# Patient Record
Sex: Female | Born: 1935
Health system: Southern US, Community
[De-identification: ages and names within clinical notes are randomized; demographics above are authoritative.]

## PROBLEM LIST (undated history)

## (undated) DIAGNOSIS — K219 Gastro-esophageal reflux disease without esophagitis: Secondary | ICD-10-CM

## (undated) DIAGNOSIS — R062 Wheezing: Secondary | ICD-10-CM

## (undated) DIAGNOSIS — R0600 Dyspnea, unspecified: Secondary | ICD-10-CM

## (undated) DIAGNOSIS — H9190 Unspecified hearing loss, unspecified ear: Secondary | ICD-10-CM

## (undated) DIAGNOSIS — R682 Dry mouth, unspecified: Secondary | ICD-10-CM

## (undated) DIAGNOSIS — R609 Edema, unspecified: Secondary | ICD-10-CM

## (undated) DIAGNOSIS — J301 Allergic rhinitis due to pollen: Secondary | ICD-10-CM

## (undated) DIAGNOSIS — R413 Other amnesia: Secondary | ICD-10-CM

## (undated) DIAGNOSIS — M810 Age-related osteoporosis without current pathological fracture: Secondary | ICD-10-CM

## (undated) DIAGNOSIS — H3530 Unspecified macular degeneration: Secondary | ICD-10-CM

## (undated) DIAGNOSIS — C801 Malignant (primary) neoplasm, unspecified: Secondary | ICD-10-CM

## (undated) HISTORY — PX: ABDOMINAL HYSTERECTOMY: SHX81

## (undated) HISTORY — DX: Gastro-esophageal reflux disease without esophagitis: K21.9

## (undated) HISTORY — DX: Allergic rhinitis due to pollen: J30.1

## (undated) HISTORY — DX: Age-related osteoporosis without current pathological fracture: M81.0

## (undated) HISTORY — PX: JOINT REPLACEMENT: SHX530

## (undated) HISTORY — DX: Unspecified macular degeneration: H35.30

## (undated) HISTORY — PX: TOTAL HIP ARTHROPLASTY: SHX124

---

## 2005-02-05 ENCOUNTER — Emergency Department: Payer: Self-pay | Admitting: Emergency Medicine

## 2009-09-29 ENCOUNTER — Ambulatory Visit: Payer: Self-pay | Admitting: Internal Medicine

## 2011-03-09 ENCOUNTER — Emergency Department: Payer: Self-pay | Admitting: Unknown Physician Specialty

## 2011-09-12 DIAGNOSIS — H353190 Nonexudative age-related macular degeneration, unspecified eye, stage unspecified: Secondary | ICD-10-CM | POA: Diagnosis not present

## 2012-06-22 DIAGNOSIS — S0003XA Contusion of scalp, initial encounter: Secondary | ICD-10-CM | POA: Diagnosis not present

## 2012-06-22 DIAGNOSIS — Z96649 Presence of unspecified artificial hip joint: Secondary | ICD-10-CM | POA: Diagnosis not present

## 2012-06-22 DIAGNOSIS — M899 Disorder of bone, unspecified: Secondary | ICD-10-CM | POA: Diagnosis not present

## 2012-06-22 DIAGNOSIS — S61409A Unspecified open wound of unspecified hand, initial encounter: Secondary | ICD-10-CM | POA: Diagnosis not present

## 2012-06-22 DIAGNOSIS — S4980XA Other specified injuries of shoulder and upper arm, unspecified arm, initial encounter: Secondary | ICD-10-CM | POA: Diagnosis not present

## 2012-06-22 DIAGNOSIS — M19049 Primary osteoarthritis, unspecified hand: Secondary | ICD-10-CM | POA: Diagnosis not present

## 2012-06-22 DIAGNOSIS — IMO0002 Reserved for concepts with insufficient information to code with codable children: Secondary | ICD-10-CM | POA: Diagnosis not present

## 2012-06-22 DIAGNOSIS — S0990XA Unspecified injury of head, initial encounter: Secondary | ICD-10-CM | POA: Diagnosis not present

## 2012-06-22 DIAGNOSIS — G8911 Acute pain due to trauma: Secondary | ICD-10-CM | POA: Diagnosis not present

## 2012-06-22 DIAGNOSIS — T148XXA Other injury of unspecified body region, initial encounter: Secondary | ICD-10-CM | POA: Diagnosis not present

## 2012-06-22 DIAGNOSIS — T1490XA Injury, unspecified, initial encounter: Secondary | ICD-10-CM | POA: Diagnosis not present

## 2012-06-22 DIAGNOSIS — W19XXXA Unspecified fall, initial encounter: Secondary | ICD-10-CM | POA: Diagnosis not present

## 2012-06-22 DIAGNOSIS — M949 Disorder of cartilage, unspecified: Secondary | ICD-10-CM | POA: Diagnosis not present

## 2012-06-22 DIAGNOSIS — S1093XA Contusion of unspecified part of neck, initial encounter: Secondary | ICD-10-CM | POA: Diagnosis not present

## 2012-06-22 DIAGNOSIS — S6990XA Unspecified injury of unspecified wrist, hand and finger(s), initial encounter: Secondary | ICD-10-CM | POA: Diagnosis not present

## 2012-06-23 DIAGNOSIS — S61409A Unspecified open wound of unspecified hand, initial encounter: Secondary | ICD-10-CM | POA: Diagnosis not present

## 2012-06-23 DIAGNOSIS — Z09 Encounter for follow-up examination after completed treatment for conditions other than malignant neoplasm: Secondary | ICD-10-CM | POA: Diagnosis not present

## 2012-06-23 DIAGNOSIS — G8911 Acute pain due to trauma: Secondary | ICD-10-CM | POA: Diagnosis not present

## 2012-06-23 DIAGNOSIS — Z96649 Presence of unspecified artificial hip joint: Secondary | ICD-10-CM | POA: Diagnosis not present

## 2012-06-23 DIAGNOSIS — Z23 Encounter for immunization: Secondary | ICD-10-CM | POA: Diagnosis not present

## 2012-08-06 DIAGNOSIS — Z23 Encounter for immunization: Secondary | ICD-10-CM | POA: Diagnosis not present

## 2012-08-27 HISTORY — PX: HIP FRACTURE SURGERY: SHX118

## 2012-09-22 ENCOUNTER — Inpatient Hospital Stay: Payer: Self-pay | Admitting: Orthopedic Surgery

## 2012-09-22 DIAGNOSIS — R6889 Other general symptoms and signs: Secondary | ICD-10-CM | POA: Diagnosis not present

## 2012-09-22 DIAGNOSIS — N182 Chronic kidney disease, stage 2 (mild): Secondary | ICD-10-CM | POA: Diagnosis not present

## 2012-09-22 DIAGNOSIS — D62 Acute posthemorrhagic anemia: Secondary | ICD-10-CM | POA: Diagnosis not present

## 2012-09-22 DIAGNOSIS — R7301 Impaired fasting glucose: Secondary | ICD-10-CM | POA: Diagnosis not present

## 2012-09-22 DIAGNOSIS — Z66 Do not resuscitate: Secondary | ICD-10-CM | POA: Diagnosis not present

## 2012-09-22 DIAGNOSIS — H3530 Unspecified macular degeneration: Secondary | ICD-10-CM | POA: Diagnosis not present

## 2012-09-22 DIAGNOSIS — K219 Gastro-esophageal reflux disease without esophagitis: Secondary | ICD-10-CM | POA: Diagnosis not present

## 2012-09-22 DIAGNOSIS — Z96649 Presence of unspecified artificial hip joint: Secondary | ICD-10-CM | POA: Diagnosis not present

## 2012-09-22 DIAGNOSIS — D649 Anemia, unspecified: Secondary | ICD-10-CM | POA: Diagnosis not present

## 2012-09-22 DIAGNOSIS — S72143A Displaced intertrochanteric fracture of unspecified femur, initial encounter for closed fracture: Secondary | ICD-10-CM | POA: Diagnosis not present

## 2012-09-22 DIAGNOSIS — R7309 Other abnormal glucose: Secondary | ICD-10-CM | POA: Diagnosis not present

## 2012-09-22 DIAGNOSIS — S7290XA Unspecified fracture of unspecified femur, initial encounter for closed fracture: Secondary | ICD-10-CM | POA: Diagnosis not present

## 2012-09-22 DIAGNOSIS — S0990XA Unspecified injury of head, initial encounter: Secondary | ICD-10-CM | POA: Diagnosis not present

## 2012-09-22 DIAGNOSIS — IMO0002 Reserved for concepts with insufficient information to code with codable children: Secondary | ICD-10-CM | POA: Diagnosis not present

## 2012-09-22 DIAGNOSIS — S72009A Fracture of unspecified part of neck of unspecified femur, initial encounter for closed fracture: Secondary | ICD-10-CM | POA: Diagnosis not present

## 2012-09-22 DIAGNOSIS — R110 Nausea: Secondary | ICD-10-CM | POA: Diagnosis not present

## 2012-09-22 DIAGNOSIS — Z01818 Encounter for other preprocedural examination: Secondary | ICD-10-CM | POA: Diagnosis not present

## 2012-09-22 DIAGNOSIS — M810 Age-related osteoporosis without current pathological fracture: Secondary | ICD-10-CM | POA: Diagnosis not present

## 2012-09-22 LAB — URINALYSIS, COMPLETE
Bacteria: NONE SEEN
Bilirubin,UR: NEGATIVE
Blood: NEGATIVE
Leukocyte Esterase: NEGATIVE
Nitrite: NEGATIVE
Ph: 7 (ref 4.5–8.0)
RBC,UR: <1 /HPF (ref 0–5)
Specific Gravity: 1.019 (ref 1.003–1.030)
Squamous Epithelial: NONE SEEN

## 2012-09-22 LAB — COMPREHENSIVE METABOLIC PANEL
Albumin: 3.6 g/dL (ref 3.4–5.0)
BUN: 18 mg/dL (ref 7–18)
Bilirubin,Total: 0.6 mg/dL (ref 0.2–1.0)
Creatinine: 0.95 mg/dL (ref 0.60–1.30)
Glucose: 123 mg/dL — ABNORMAL HIGH (ref 65–99)
SGPT (ALT): 20 U/L (ref 12–78)
Sodium: 140 mmol/L (ref 136–145)

## 2012-09-22 LAB — CBC
HCT: 36.6 % (ref 35.0–47.0)
HGB: 12.5 g/dL (ref 12.0–16.0)
MCH: 32.2 pg (ref 26.0–34.0)
MCHC: 34 g/dL (ref 32.0–36.0)
Platelet: 164 10*3/uL (ref 150–440)
RBC: 3.87 10*6/uL (ref 3.80–5.20)
RDW: 13 % (ref 11.5–14.5)
WBC: 8.5 10*3/uL (ref 3.6–11.0)

## 2012-09-23 LAB — BASIC METABOLIC PANEL
Anion Gap: 8 (ref 7–16)
BUN: 19 mg/dL — ABNORMAL HIGH (ref 7–18)
Calcium, Total: 8.2 mg/dL — ABNORMAL LOW (ref 8.5–10.1)
Co2: 25 mmol/L (ref 21–32)
EGFR (African American): 51 — ABNORMAL LOW
Osmolality: 277 (ref 275–301)
Potassium: 4.2 mmol/L (ref 3.5–5.1)
Sodium: 137 mmol/L (ref 136–145)

## 2012-09-23 LAB — CBC WITH DIFFERENTIAL/PLATELET
Basophil #: 0 10*3/uL (ref 0.0–0.1)
Eosinophil #: 0.1 10*3/uL (ref 0.0–0.7)
Eosinophil %: 1 %
HCT: 29.4 % — ABNORMAL LOW (ref 35.0–47.0)
Lymphocyte #: 0.8 10*3/uL — ABNORMAL LOW (ref 1.0–3.6)
Lymphocyte %: 9.8 %
MCHC: 34.5 g/dL (ref 32.0–36.0)
MCV: 94 fL (ref 80–100)
Monocyte %: 10.1 %
Neutrophil %: 78.7 %
Platelet: 170 10*3/uL (ref 150–440)
RBC: 3.13 10*6/uL — ABNORMAL LOW (ref 3.80–5.20)

## 2012-09-24 LAB — CBC WITH DIFFERENTIAL/PLATELET
Basophil %: 0.4 %
Eosinophil %: 5.8 %
HCT: 21.2 % — ABNORMAL LOW (ref 35.0–47.0)
HGB: 7.1 g/dL — ABNORMAL LOW (ref 12.0–16.0)
Lymphocyte #: 0.8 10*3/uL — ABNORMAL LOW (ref 1.0–3.6)
MCH: 31.8 pg (ref 26.0–34.0)
MCHC: 33.5 g/dL (ref 32.0–36.0)
Neutrophil %: 67.5 %
Platelet: 128 10*3/uL — ABNORMAL LOW (ref 150–440)
RBC: 2.24 10*6/uL — ABNORMAL LOW (ref 3.80–5.20)
WBC: 6.2 10*3/uL (ref 3.6–11.0)

## 2012-09-24 LAB — BASIC METABOLIC PANEL
Anion Gap: 7 (ref 7–16)
Chloride: 101 mmol/L (ref 98–107)
Creatinine: 1.24 mg/dL (ref 0.60–1.30)
EGFR (Non-African Amer.): 38 — ABNORMAL LOW
Glucose: 115 mg/dL — ABNORMAL HIGH (ref 65–99)
Osmolality: 273 (ref 275–301)
Potassium: 4.2 mmol/L (ref 3.5–5.1)

## 2012-09-26 DIAGNOSIS — J309 Allergic rhinitis, unspecified: Secondary | ICD-10-CM | POA: Diagnosis not present

## 2012-09-26 DIAGNOSIS — H3530 Unspecified macular degeneration: Secondary | ICD-10-CM | POA: Diagnosis not present

## 2012-09-26 DIAGNOSIS — S72009A Fracture of unspecified part of neck of unspecified femur, initial encounter for closed fracture: Secondary | ICD-10-CM | POA: Diagnosis not present

## 2012-09-26 DIAGNOSIS — S7290XA Unspecified fracture of unspecified femur, initial encounter for closed fracture: Secondary | ICD-10-CM | POA: Diagnosis not present

## 2012-09-26 DIAGNOSIS — Z96649 Presence of unspecified artificial hip joint: Secondary | ICD-10-CM | POA: Diagnosis not present

## 2012-09-26 DIAGNOSIS — K219 Gastro-esophageal reflux disease without esophagitis: Secondary | ICD-10-CM | POA: Diagnosis not present

## 2012-09-26 DIAGNOSIS — R110 Nausea: Secondary | ICD-10-CM | POA: Diagnosis not present

## 2012-09-26 DIAGNOSIS — S72143A Displaced intertrochanteric fracture of unspecified femur, initial encounter for closed fracture: Secondary | ICD-10-CM | POA: Diagnosis not present

## 2012-09-26 DIAGNOSIS — N182 Chronic kidney disease, stage 2 (mild): Secondary | ICD-10-CM | POA: Diagnosis not present

## 2012-09-26 DIAGNOSIS — R6889 Other general symptoms and signs: Secondary | ICD-10-CM | POA: Diagnosis not present

## 2012-09-26 DIAGNOSIS — M810 Age-related osteoporosis without current pathological fracture: Secondary | ICD-10-CM | POA: Diagnosis not present

## 2012-09-26 DIAGNOSIS — R269 Unspecified abnormalities of gait and mobility: Secondary | ICD-10-CM | POA: Diagnosis not present

## 2012-09-26 LAB — BASIC METABOLIC PANEL
Anion Gap: 8 (ref 7–16)
BUN: 18 mg/dL (ref 7–18)
Calcium, Total: 8.4 mg/dL — ABNORMAL LOW (ref 8.5–10.1)
Chloride: 103 mmol/L (ref 98–107)
Creatinine: 0.99 mg/dL (ref 0.60–1.30)
EGFR (Non-African Amer.): 50 — ABNORMAL LOW
Glucose: 131 mg/dL — ABNORMAL HIGH (ref 65–99)
Osmolality: 276 (ref 275–301)
Potassium: 4.2 mmol/L (ref 3.5–5.1)

## 2012-09-27 DIAGNOSIS — M810 Age-related osteoporosis without current pathological fracture: Secondary | ICD-10-CM | POA: Diagnosis not present

## 2012-09-27 DIAGNOSIS — R110 Nausea: Secondary | ICD-10-CM | POA: Diagnosis not present

## 2012-09-27 DIAGNOSIS — H3530 Unspecified macular degeneration: Secondary | ICD-10-CM | POA: Diagnosis not present

## 2012-09-27 DIAGNOSIS — Z96649 Presence of unspecified artificial hip joint: Secondary | ICD-10-CM | POA: Diagnosis not present

## 2012-09-27 DIAGNOSIS — S72143A Displaced intertrochanteric fracture of unspecified femur, initial encounter for closed fracture: Secondary | ICD-10-CM | POA: Diagnosis not present

## 2012-09-27 DIAGNOSIS — R269 Unspecified abnormalities of gait and mobility: Secondary | ICD-10-CM | POA: Diagnosis not present

## 2012-09-27 DIAGNOSIS — S72009A Fracture of unspecified part of neck of unspecified femur, initial encounter for closed fracture: Secondary | ICD-10-CM | POA: Diagnosis not present

## 2012-09-27 DIAGNOSIS — R6889 Other general symptoms and signs: Secondary | ICD-10-CM | POA: Diagnosis not present

## 2012-09-27 DIAGNOSIS — S7290XA Unspecified fracture of unspecified femur, initial encounter for closed fracture: Secondary | ICD-10-CM | POA: Diagnosis not present

## 2012-09-27 DIAGNOSIS — K219 Gastro-esophageal reflux disease without esophagitis: Secondary | ICD-10-CM | POA: Diagnosis not present

## 2012-09-27 DIAGNOSIS — J309 Allergic rhinitis, unspecified: Secondary | ICD-10-CM | POA: Diagnosis not present

## 2012-09-27 DIAGNOSIS — N182 Chronic kidney disease, stage 2 (mild): Secondary | ICD-10-CM | POA: Diagnosis not present

## 2012-09-30 DIAGNOSIS — J309 Allergic rhinitis, unspecified: Secondary | ICD-10-CM

## 2012-09-30 DIAGNOSIS — S7290XA Unspecified fracture of unspecified femur, initial encounter for closed fracture: Secondary | ICD-10-CM

## 2012-09-30 DIAGNOSIS — K219 Gastro-esophageal reflux disease without esophagitis: Secondary | ICD-10-CM

## 2012-10-06 DIAGNOSIS — S72143A Displaced intertrochanteric fracture of unspecified femur, initial encounter for closed fracture: Secondary | ICD-10-CM | POA: Diagnosis not present

## 2012-10-22 DIAGNOSIS — R269 Unspecified abnormalities of gait and mobility: Secondary | ICD-10-CM | POA: Diagnosis not present

## 2012-10-22 DIAGNOSIS — M6281 Muscle weakness (generalized): Secondary | ICD-10-CM | POA: Diagnosis not present

## 2012-10-22 DIAGNOSIS — M25569 Pain in unspecified knee: Secondary | ICD-10-CM | POA: Diagnosis not present

## 2012-10-23 DIAGNOSIS — R269 Unspecified abnormalities of gait and mobility: Secondary | ICD-10-CM | POA: Diagnosis not present

## 2012-10-28 DIAGNOSIS — M25569 Pain in unspecified knee: Secondary | ICD-10-CM | POA: Diagnosis not present

## 2012-10-28 DIAGNOSIS — R269 Unspecified abnormalities of gait and mobility: Secondary | ICD-10-CM | POA: Diagnosis not present

## 2012-10-28 DIAGNOSIS — M6281 Muscle weakness (generalized): Secondary | ICD-10-CM | POA: Diagnosis not present

## 2012-10-30 DIAGNOSIS — R269 Unspecified abnormalities of gait and mobility: Secondary | ICD-10-CM | POA: Diagnosis not present

## 2012-10-31 DIAGNOSIS — M25569 Pain in unspecified knee: Secondary | ICD-10-CM | POA: Diagnosis not present

## 2012-10-31 DIAGNOSIS — R269 Unspecified abnormalities of gait and mobility: Secondary | ICD-10-CM | POA: Diagnosis not present

## 2012-11-04 DIAGNOSIS — R269 Unspecified abnormalities of gait and mobility: Secondary | ICD-10-CM | POA: Diagnosis not present

## 2012-11-05 DIAGNOSIS — S72143A Displaced intertrochanteric fracture of unspecified femur, initial encounter for closed fracture: Secondary | ICD-10-CM | POA: Diagnosis not present

## 2012-11-07 DIAGNOSIS — R269 Unspecified abnormalities of gait and mobility: Secondary | ICD-10-CM | POA: Diagnosis not present

## 2012-11-07 DIAGNOSIS — M6281 Muscle weakness (generalized): Secondary | ICD-10-CM | POA: Diagnosis not present

## 2012-11-07 DIAGNOSIS — M25569 Pain in unspecified knee: Secondary | ICD-10-CM | POA: Diagnosis not present

## 2012-11-11 ENCOUNTER — Encounter: Payer: Self-pay | Admitting: Internal Medicine

## 2012-11-11 DIAGNOSIS — K219 Gastro-esophageal reflux disease without esophagitis: Secondary | ICD-10-CM | POA: Insufficient documentation

## 2012-11-11 DIAGNOSIS — J301 Allergic rhinitis due to pollen: Secondary | ICD-10-CM | POA: Insufficient documentation

## 2012-11-11 DIAGNOSIS — H3530 Unspecified macular degeneration: Secondary | ICD-10-CM | POA: Insufficient documentation

## 2012-11-12 ENCOUNTER — Ambulatory Visit (INDEPENDENT_AMBULATORY_CARE_PROVIDER_SITE_OTHER): Payer: Medicare Other | Admitting: Internal Medicine

## 2012-11-12 ENCOUNTER — Encounter: Payer: Self-pay | Admitting: Internal Medicine

## 2012-11-12 VITALS — BP 118/68 | HR 68 | Temp 97.9°F | Ht 59.5 in | Wt 101.0 lb

## 2012-11-12 DIAGNOSIS — K219 Gastro-esophageal reflux disease without esophagitis: Secondary | ICD-10-CM | POA: Diagnosis not present

## 2012-11-12 DIAGNOSIS — D649 Anemia, unspecified: Secondary | ICD-10-CM | POA: Insufficient documentation

## 2012-11-12 DIAGNOSIS — M810 Age-related osteoporosis without current pathological fracture: Secondary | ICD-10-CM | POA: Diagnosis not present

## 2012-11-12 DIAGNOSIS — G479 Sleep disorder, unspecified: Secondary | ICD-10-CM

## 2012-11-12 DIAGNOSIS — Z1331 Encounter for screening for depression: Secondary | ICD-10-CM | POA: Diagnosis not present

## 2012-11-12 DIAGNOSIS — M818 Other osteoporosis without current pathological fracture: Secondary | ICD-10-CM | POA: Diagnosis not present

## 2012-11-12 LAB — HEPATIC FUNCTION PANEL
ALT: 15 U/L (ref 0–35)
AST: 20 U/L (ref 0–37)
Albumin: 4.1 g/dL (ref 3.5–5.2)
Alkaline Phosphatase: 115 U/L (ref 39–117)

## 2012-11-12 LAB — CBC WITH DIFFERENTIAL/PLATELET
Basophils Absolute: 0 10*3/uL (ref 0.0–0.1)
Eosinophils Relative: 7 % — ABNORMAL HIGH (ref 0.0–5.0)
HCT: 38.9 % (ref 36.0–46.0)
Lymphs Abs: 1 10*3/uL (ref 0.7–4.0)
Monocytes Absolute: 0.6 10*3/uL (ref 0.1–1.0)
Monocytes Relative: 10.9 % (ref 3.0–12.0)
Neutrophils Relative %: 61.6 % (ref 43.0–77.0)
Platelets: 226 10*3/uL (ref 150.0–400.0)
RDW: 14.8 % — ABNORMAL HIGH (ref 11.5–14.6)
WBC: 5.2 10*3/uL (ref 4.5–10.5)

## 2012-11-12 LAB — TSH: TSH: 2.97 u[IU]/mL (ref 0.35–5.50)

## 2012-11-12 LAB — BASIC METABOLIC PANEL
CO2: 25 mEq/L (ref 19–32)
Calcium: 9.7 mg/dL (ref 8.4–10.5)
Creatinine, Ser: 1.1 mg/dL (ref 0.4–1.2)

## 2012-11-12 MED ORDER — TRAZODONE HCL 50 MG PO TABS: 25.0000 mg | ORAL_TABLET | Freq: Every evening | ORAL | Status: DC | PRN

## 2012-11-12 NOTE — Assessment & Plan Note (Signed)
Has had a problem since the surgery Will try trazodone

## 2012-11-12 NOTE — Assessment & Plan Note (Signed)
Clinical diagnosis with fracture and shortened stature On vitamin D and tums Discussed proper exercise program

## 2012-11-12 NOTE — Patient Instructions (Signed)
Please start a balance and strength program with Casimiro Needle at [facility] If you have ongoing cough issue at night, please try ranitidine 150mg  at bedtime in case it is related to acid reflux

## 2012-11-12 NOTE — Assessment & Plan Note (Signed)
Will recheck labs Stop iron if Hgb over 11

## 2012-11-12 NOTE — Addendum Note (Signed)
Addended by: Tillman Abide I on: 11/12/2012 01:55 PM   Modules accepted: Orders

## 2012-11-12 NOTE — Progress Notes (Signed)
  Subjective:    Patient ID: Toni Henry, female    DOB: [birth year], 79 y.o.   MRN: [id number]  HPI Here with daughter Establishing care here I saw her at [facility] after a hip fracture repair  Lives alone Not driving yet but hopes to restart soon Daughter does her shopping Able to do her cleaning and laundry Walks with rolling walker---and cane in house  Still some tiredness Trouble sleeping since the surgery--wants med for this  Still on iron since post op anemia Bowels okay with senna  No recent heartburn Stomach "feels a little different than it used to" Swallows okay but has cough at times and notes phlegm at night  No diagnosis of osteoporosis Surgeon said her bones looked fairly good Doesn't want bisphosphonate Is on vitamin D Knows she is getting shorter  No current outpatient prescriptions on file prior to visit.   No current facility-administered medications on file prior to visit.    No Known Allergies  Past Medical History  Diagnosis Date  . GERD (gastroesophageal reflux disease)   . Allergic rhinitis due to pollen   . Macular degeneration     Past Surgical History  Procedure Laterality Date  . Total hip arthroplasty Right ~2010  . Hip fracture surgery Left 1/14  . Abdominal hysterectomy      No family history on file.  History   Social History  . Marital Status: Widowed    Spouse Name: N/A    Number of Children: 2  . Years of Education: N/A   Occupational History  . Schoolteacher     Retired   Social History Main Topics  . Smoking status: Never Smoker   . Smokeless tobacco: Never Used  . Alcohol Use: Yes  . Drug Use: No  . Sexually Active: Not on file   Other Topics Concern  . Not on file   Social History Narrative   Widowed ~2012   2 daughters      Has living will   Designated SIL Richard as health care POA   Requests DNR--form done 11/12/12   Would not accept feeding tube   Review of Systems Appetite is okay Weight is  stable    Objective:   Physical Exam  Constitutional: She appears well-developed and well-nourished. No distress.  Neck: Normal range of motion. Neck supple. No thyromegaly present.  Cardiovascular: Normal rate, regular rhythm and normal heart sounds.  Exam reveals no gallop.   No murmur heard. Pulmonary/Chest: Effort normal and breath sounds normal. No respiratory distress. She has no wheezes. She has no rales.  Abdominal: Soft. There is no tenderness.  Musculoskeletal: She exhibits no edema and no tenderness.  Pelvis and ribs almost touching  Lymphadenopathy:    She has no cervical adenopathy.  Psychiatric: She has a normal mood and affect. Her behavior is normal.          Assessment & Plan:

## 2012-11-12 NOTE — Assessment & Plan Note (Signed)
May be causing the cough at night Discussed raising the [facility] Will try ranitidine if it persists

## 2012-11-17 DIAGNOSIS — M6281 Muscle weakness (generalized): Secondary | ICD-10-CM | POA: Diagnosis not present

## 2012-11-17 DIAGNOSIS — M25569 Pain in unspecified knee: Secondary | ICD-10-CM | POA: Diagnosis not present

## 2012-11-17 DIAGNOSIS — R269 Unspecified abnormalities of gait and mobility: Secondary | ICD-10-CM | POA: Diagnosis not present

## 2012-11-18 DIAGNOSIS — M25569 Pain in unspecified knee: Secondary | ICD-10-CM | POA: Diagnosis not present

## 2012-11-18 DIAGNOSIS — M6281 Muscle weakness (generalized): Secondary | ICD-10-CM | POA: Diagnosis not present

## 2012-11-18 DIAGNOSIS — R269 Unspecified abnormalities of gait and mobility: Secondary | ICD-10-CM | POA: Diagnosis not present

## 2012-11-20 DIAGNOSIS — R269 Unspecified abnormalities of gait and mobility: Secondary | ICD-10-CM | POA: Diagnosis not present

## 2012-11-20 DIAGNOSIS — M25569 Pain in unspecified knee: Secondary | ICD-10-CM | POA: Diagnosis not present

## 2012-11-20 DIAGNOSIS — M6281 Muscle weakness (generalized): Secondary | ICD-10-CM | POA: Diagnosis not present

## 2012-11-24 DIAGNOSIS — R269 Unspecified abnormalities of gait and mobility: Secondary | ICD-10-CM | POA: Diagnosis not present

## 2012-11-24 DIAGNOSIS — M6281 Muscle weakness (generalized): Secondary | ICD-10-CM | POA: Diagnosis not present

## 2012-11-24 DIAGNOSIS — M25569 Pain in unspecified knee: Secondary | ICD-10-CM | POA: Diagnosis not present

## 2012-11-25 DIAGNOSIS — S72009A Fracture of unspecified part of neck of unspecified femur, initial encounter for closed fracture: Secondary | ICD-10-CM | POA: Diagnosis not present

## 2012-11-27 DIAGNOSIS — S72009A Fracture of unspecified part of neck of unspecified femur, initial encounter for closed fracture: Secondary | ICD-10-CM | POA: Diagnosis not present

## 2013-01-28 ENCOUNTER — Ambulatory Visit: Payer: Self-pay | Admitting: Internal Medicine

## 2013-03-24 DIAGNOSIS — Z85828 Personal history of other malignant neoplasm of skin: Secondary | ICD-10-CM | POA: Diagnosis not present

## 2013-03-24 DIAGNOSIS — D692 Other nonthrombocytopenic purpura: Secondary | ICD-10-CM | POA: Diagnosis not present

## 2013-03-24 DIAGNOSIS — L578 Other skin changes due to chronic exposure to nonionizing radiation: Secondary | ICD-10-CM | POA: Diagnosis not present

## 2013-03-24 DIAGNOSIS — L570 Actinic keratosis: Secondary | ICD-10-CM | POA: Diagnosis not present

## 2013-03-27 DIAGNOSIS — H6980 Other specified disorders of Eustachian tube, unspecified ear: Secondary | ICD-10-CM | POA: Diagnosis not present

## 2013-03-27 DIAGNOSIS — J310 Chronic rhinitis: Secondary | ICD-10-CM | POA: Diagnosis not present

## 2013-03-27 DIAGNOSIS — H6500 Acute serous otitis media, unspecified ear: Secondary | ICD-10-CM | POA: Diagnosis not present

## 2013-03-27 DIAGNOSIS — H6120 Impacted cerumen, unspecified ear: Secondary | ICD-10-CM | POA: Diagnosis not present

## 2013-04-01 ENCOUNTER — Other Ambulatory Visit: Payer: Self-pay

## 2013-05-08 DIAGNOSIS — H6500 Acute serous otitis media, unspecified ear: Secondary | ICD-10-CM | POA: Diagnosis not present

## 2013-05-08 DIAGNOSIS — J310 Chronic rhinitis: Secondary | ICD-10-CM | POA: Diagnosis not present

## 2013-05-08 DIAGNOSIS — H6980 Other specified disorders of Eustachian tube, unspecified ear: Secondary | ICD-10-CM | POA: Diagnosis not present

## 2013-05-18 ENCOUNTER — Ambulatory Visit: Payer: Medicare Other | Admitting: Internal Medicine

## 2013-05-28 ENCOUNTER — Ambulatory Visit (INDEPENDENT_AMBULATORY_CARE_PROVIDER_SITE_OTHER): Payer: Medicare Other | Admitting: Internal Medicine

## 2013-05-28 ENCOUNTER — Encounter: Payer: Self-pay | Admitting: Internal Medicine

## 2013-05-28 VITALS — BP 110/60 | HR 64 | Temp 98.2°F | Wt 102.0 lb

## 2013-05-28 DIAGNOSIS — M818 Other osteoporosis without current pathological fracture: Secondary | ICD-10-CM

## 2013-05-28 DIAGNOSIS — M47812 Spondylosis without myelopathy or radiculopathy, cervical region: Secondary | ICD-10-CM

## 2013-05-28 DIAGNOSIS — G479 Sleep disorder, unspecified: Secondary | ICD-10-CM | POA: Diagnosis not present

## 2013-05-28 DIAGNOSIS — J301 Allergic rhinitis due to pollen: Secondary | ICD-10-CM | POA: Diagnosis not present

## 2013-05-28 NOTE — Assessment & Plan Note (Signed)
Has some muscle tightness as well Discussed heat Regular extended tylenol

## 2013-05-28 NOTE — Assessment & Plan Note (Signed)
Doing well with the low dose trazodone

## 2013-05-28 NOTE — Patient Instructions (Signed)
Please try loratadine 10mg  1 tab daily or twice a day to see if that helps the drainage (generic of allergy pill claritin)

## 2013-05-28 NOTE — Assessment & Plan Note (Signed)
On vitamin D and calcium Regular exercise group and walking (no longer using cane)

## 2013-05-28 NOTE — Progress Notes (Signed)
  Subjective:    Patient ID: Toni Henry, female    DOB: December 10, 1919, 79 y.o.   MRN: [id number]  HPI Here for follow up Back to driving Does her shopping again Independent with instrumental ADLs  Has a stiff neck and clicking in her head ROM exercises seem to help No known injury Doesn't keep her up but notices it first thing in the morning Has varied her pillows--no clear relationship Hasn't tried any meds  Sleeping well now Just uses 25mg  of trazodone  No heartburn Does have some phlegm---affects her swallowing No overt dysphagia  Current Outpatient Prescriptions on File Prior to Visit  Medication Sig Dispense Refill  . acetaminophen (TYLENOL) 500 MG tablet Take 500 mg by mouth 2 (two) times daily as needed for pain.      . calcium carbonate (TUMS EX) 750 MG chewable tablet Chew 1 tablet by mouth as needed for heartburn.      . Cholecalciferol (VITAMIN D3) 2000 UNITS TABS Take 1 tablet by mouth daily.      . sennosides-docusate sodium (SENOKOT-S) 8.6-50 MG tablet Take 2 tablets by mouth as needed for constipation.        No current facility-administered medications on file prior to visit.    No Known Allergies  Past Medical History  Diagnosis Date  . GERD (gastroesophageal reflux disease)   . Allergic rhinitis due to pollen   . Macular degeneration   . Osteoporosis     Past Surgical History  Procedure Laterality Date  . Total hip arthroplasty Right ~2010  . Hip fracture surgery Left 1/14  . Abdominal hysterectomy      No family history on file.  History   Social History  . Marital Status: Widowed    Spouse Name: N/A    Number of Children: 2  . Years of Education: N/A   Occupational History  . Schoolteacher     Retired   Social History Main Topics  . Smoking status: Never Smoker   . Smokeless tobacco: Never Used  . Alcohol Use: Yes  . Drug Use: No  . Sexual Activity: Not on file   Other Topics Concern  . Not on file   Social History Narrative    Widowed ~2012   2 daughters      Has living will   Designated SIL Tylor Gambrill as health care POA   Requests DNR--form done 11/12/12   Would not accept feeding tube   Review of Systems Seeing ENT now--past shunt in right ear Appetite is good Weight up 1#    Objective:   Physical Exam  Constitutional: She appears well-developed and well-nourished. No distress.  Neck: No thyromegaly present.  No tenderness Mild restriction in flexion/extension Significant restriction in tilt and rotation both ways  Cardiovascular: Normal rate, regular rhythm and normal heart sounds.  Exam reveals no gallop.   No murmur heard. Pulmonary/Chest: Effort normal and breath sounds normal. No respiratory distress. She has no wheezes. She has no rales.  Musculoskeletal: She exhibits no edema and no tenderness.  Lymphadenopathy:    She has no cervical adenopathy.  Psychiatric: She has a normal mood and affect. Her behavior is normal.          Assessment & Plan:

## 2013-05-28 NOTE — Assessment & Plan Note (Signed)
Discussed prn meds 

## 2013-06-16 IMAGING — CT CT HEAD WITHOUT CONTRAST
3 series · 18 of 30 positions shown, 20 images · non-contrast
Comparison: none

REASON FOR EXAM: head injury
COMMENTS:

[Series 2: soft tissue · axial · 0.39mm/px · z∈[-21,+99]mm · 7 of 32 slices shown]
[im 4/32  brain]
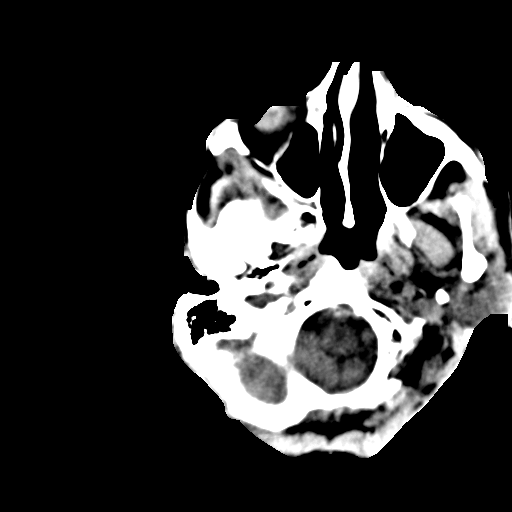
[im 8/32  brain]
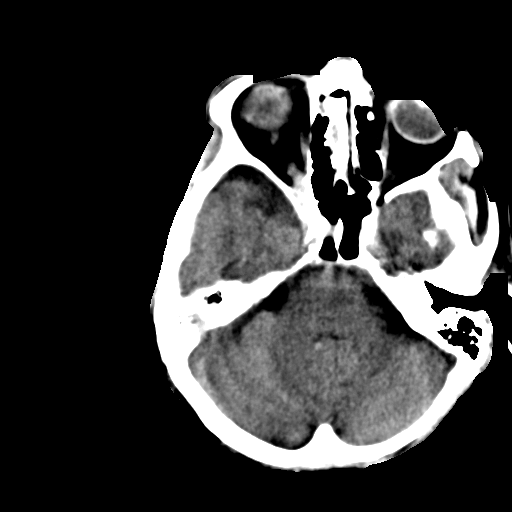
[im 12/32  brain]
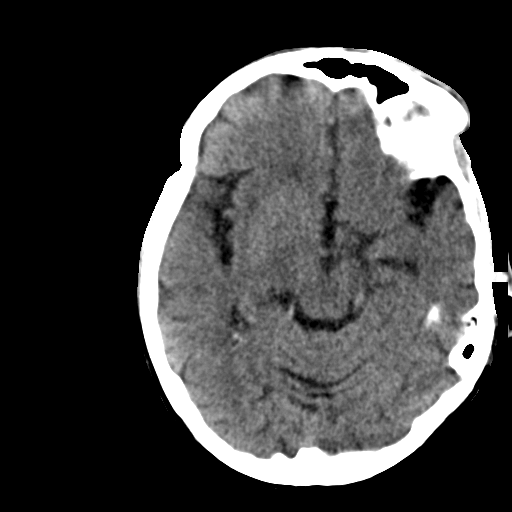
[im 16/32  brain]
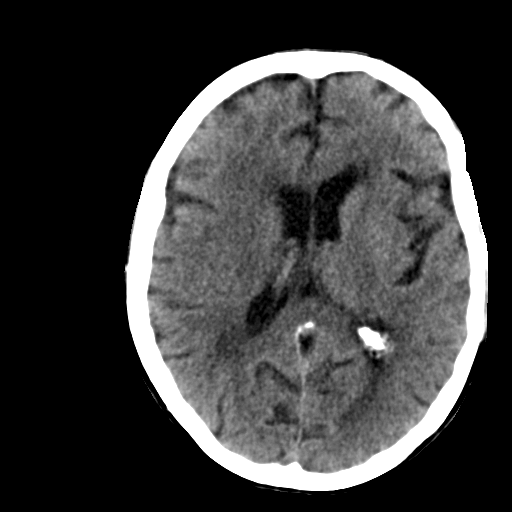
[im 20/32  brain]
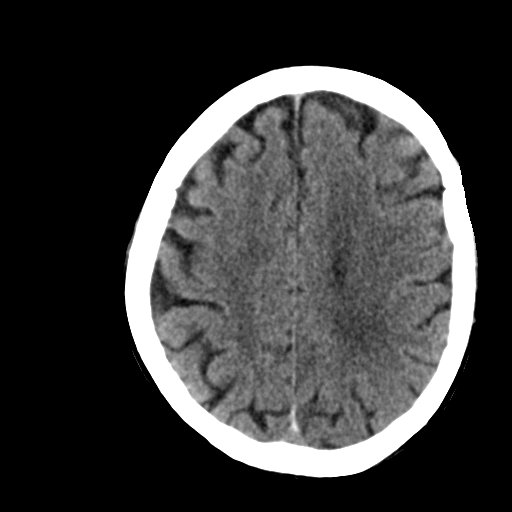
[im 24/32  brain]
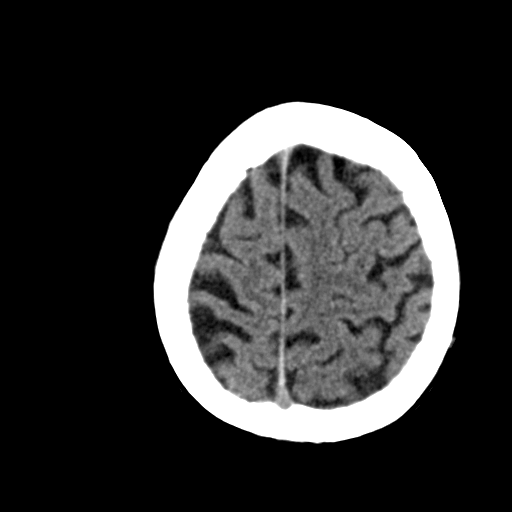
[im 28/32  brain]
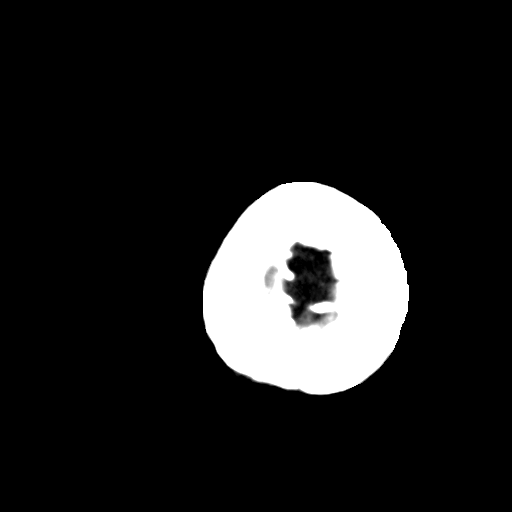

[Series 4: soft tissue 2 · axial · 0.39mm/px · z∈[-62,+74]mm · 8 of 36 slices shown, 10 images]
[im 4/36  brain]
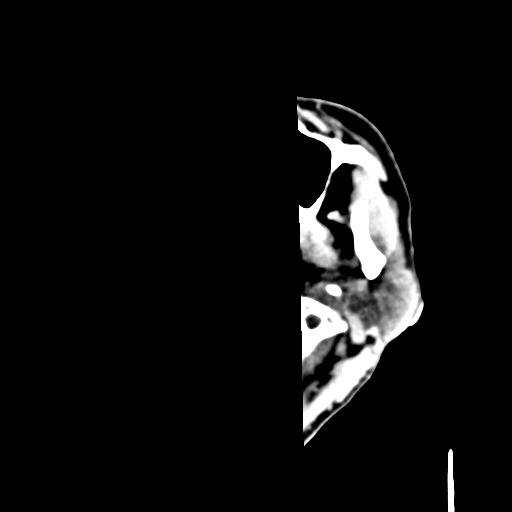
[im 4/36  bone]
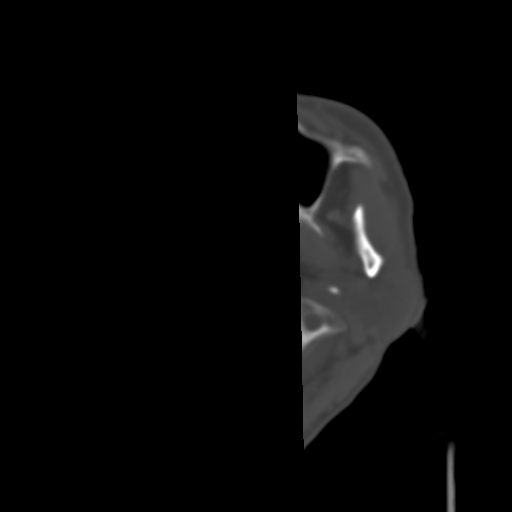
[im 8/36  brain]
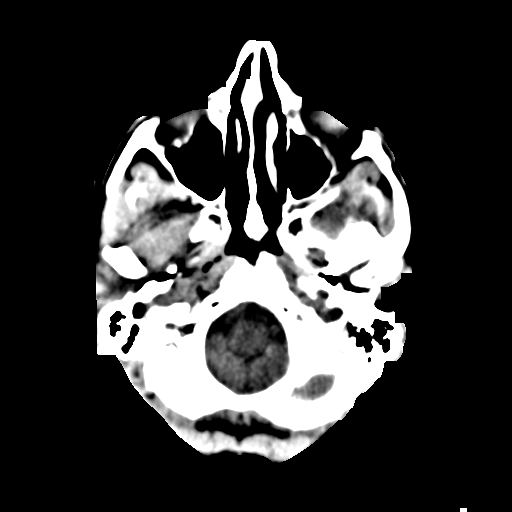
[im 12/36  brain]
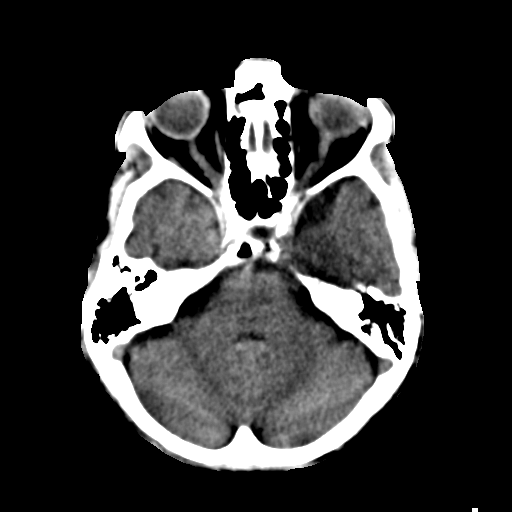
[im 16/36  brain]
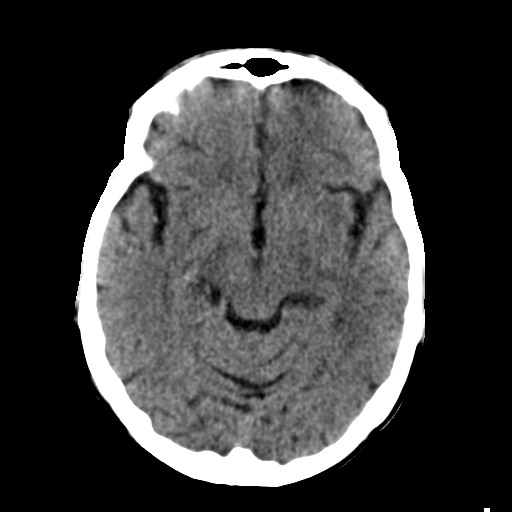
[im 20/36  brain]
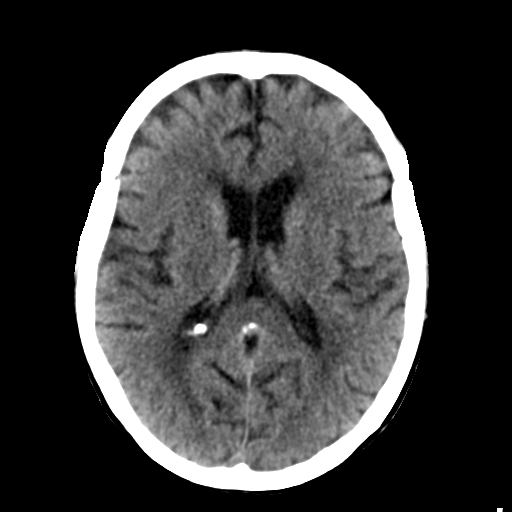
[im 20/36  bone]
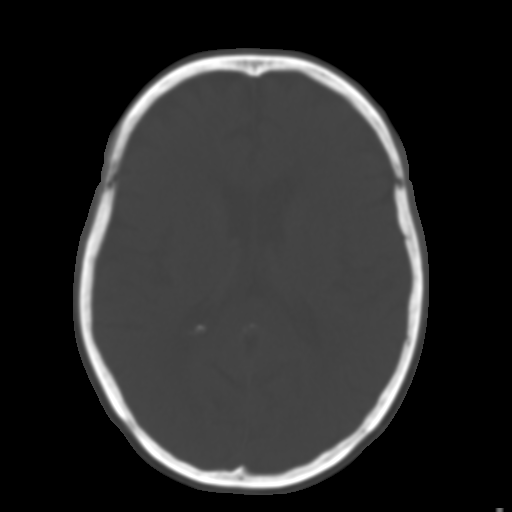
[im 24/36  brain]
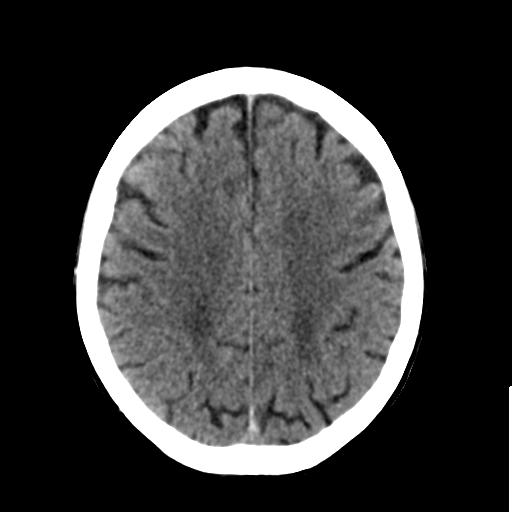
[im 28/36  brain]
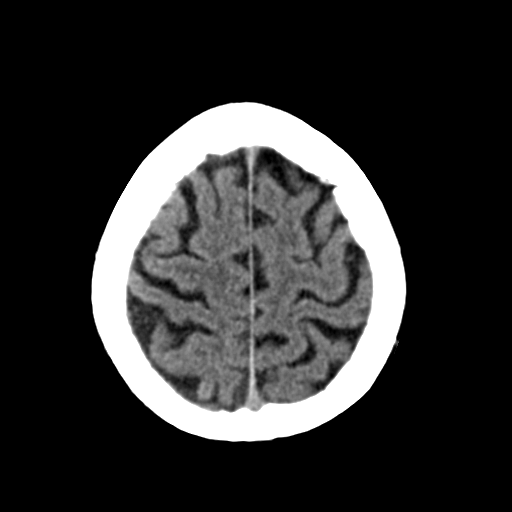
[im 32/36  brain]
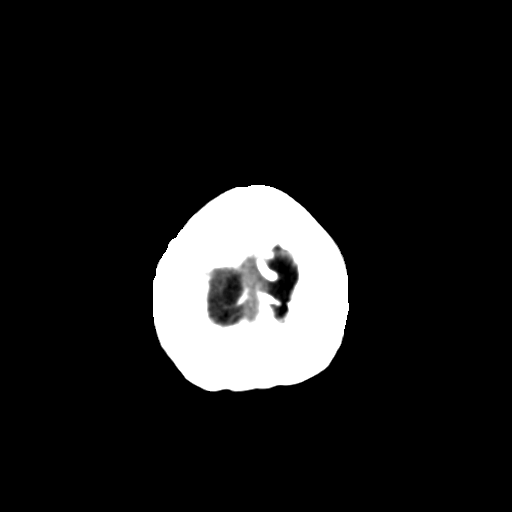

[Series 5: bone 2 · axial · 0.39mm/px · z∈[-58,-24]mm · 3 of 37 slices shown]
[im 4/37  bone]
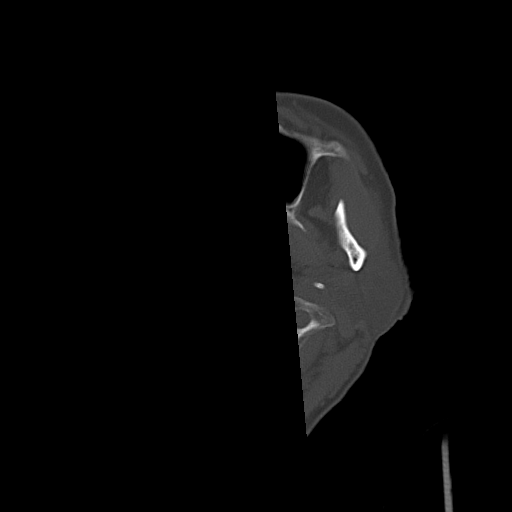
[im 8/37  bone]
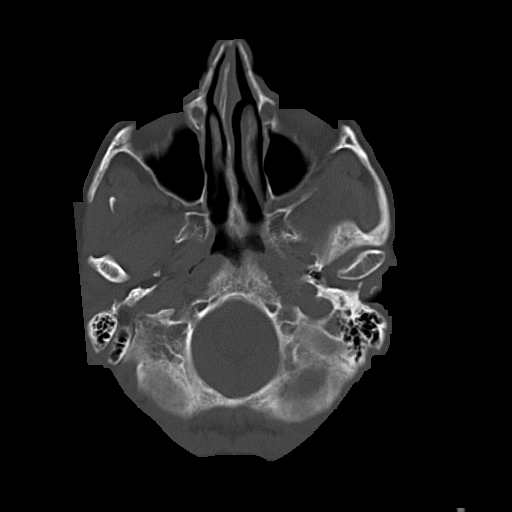
[im 11/37  bone]
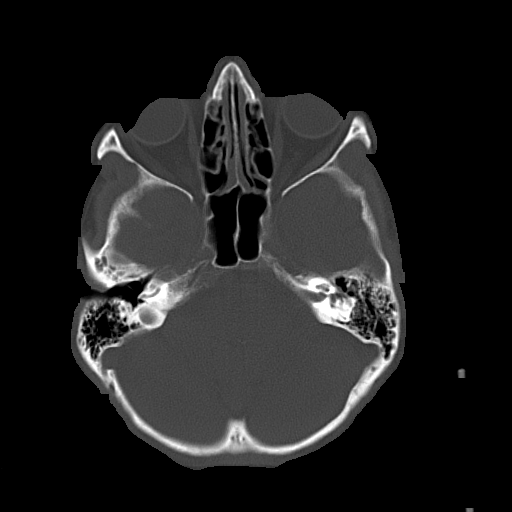

[18 of 30 positions shown; findings below may reference images not displayed]

PROCEDURE:     CT  - CT HEAD WITHOUT CONTRAST  - September 22, 2012  [DATE]

RESULT:     Emergent noncontrast CT of the brain demonstrates prominence of
the ventricles and sulci. Extensive low-attenuation is seen in the
periventricular and subcortical white matter. There is no intracranial
hemorrhage, mass or mass effect. No evolving infarct is evident. The orbits
are unremarkable. The paranasal sinuses and mastoid air cells show normal
aeration. The calvarium shows no depressed skull fracture.
IMPRESSION: 1. Changes of atrophy and chronic microvascular ischemic disease. No acute
intracranial abnormality evident.

 [REDACTED]

## 2013-06-25 DIAGNOSIS — Z23 Encounter for immunization: Secondary | ICD-10-CM | POA: Diagnosis not present

## 2013-06-29 DIAGNOSIS — L819 Disorder of pigmentation, unspecified: Secondary | ICD-10-CM | POA: Diagnosis not present

## 2013-06-29 DIAGNOSIS — L570 Actinic keratosis: Secondary | ICD-10-CM | POA: Diagnosis not present

## 2013-06-29 DIAGNOSIS — Z85828 Personal history of other malignant neoplasm of skin: Secondary | ICD-10-CM | POA: Diagnosis not present

## 2013-06-29 DIAGNOSIS — L578 Other skin changes due to chronic exposure to nonionizing radiation: Secondary | ICD-10-CM | POA: Diagnosis not present

## 2013-06-29 DIAGNOSIS — L821 Other seborrheic keratosis: Secondary | ICD-10-CM | POA: Diagnosis not present

## 2013-07-02 ENCOUNTER — Other Ambulatory Visit: Payer: Self-pay

## 2013-11-26 ENCOUNTER — Encounter: Payer: Medicare Other | Admitting: Internal Medicine

## 2014-02-01 ENCOUNTER — Telehealth: Payer: Self-pay

## 2014-02-01 NOTE — Telephone Encounter (Signed)
She would not know but I will have her review salient features of the visit at my direction and write out any instructions on the AVS

## 2014-02-01 NOTE — Telephone Encounter (Signed)
Dr Roosevelt Locks pts son in law said pt has appt on 02/03/14 with Dr Silvio Pate for dry mouth; does not think pt is using biotin correctly and not hydrating well; but main reason for call is to ask Dr Silvio Pate to have his CMA go back in after visit to review what Dr Silvio Pate has instructed pt; pt does not hear well. Dr Leda Roys said no call back needed.

## 2014-02-03 ENCOUNTER — Encounter: Payer: Self-pay | Admitting: Internal Medicine

## 2014-02-03 ENCOUNTER — Ambulatory Visit (INDEPENDENT_AMBULATORY_CARE_PROVIDER_SITE_OTHER): Payer: Medicare Other | Admitting: Internal Medicine

## 2014-02-03 VITALS — BP 130/70 | HR 65 | Temp 98.0°F | Wt 99.0 lb

## 2014-02-03 DIAGNOSIS — M47812 Spondylosis without myelopathy or radiculopathy, cervical region: Secondary | ICD-10-CM | POA: Diagnosis not present

## 2014-02-03 DIAGNOSIS — E441 Mild protein-calorie malnutrition: Secondary | ICD-10-CM

## 2014-02-03 DIAGNOSIS — K219 Gastro-esophageal reflux disease without esophagitis: Secondary | ICD-10-CM

## 2014-02-03 DIAGNOSIS — K117 Disturbances of salivary secretion: Secondary | ICD-10-CM

## 2014-02-03 DIAGNOSIS — R682 Dry mouth, unspecified: Secondary | ICD-10-CM | POA: Insufficient documentation

## 2014-02-03 DIAGNOSIS — E440 Moderate protein-calorie malnutrition: Secondary | ICD-10-CM | POA: Insufficient documentation

## 2014-02-03 DIAGNOSIS — G479 Sleep disorder, unspecified: Secondary | ICD-10-CM

## 2014-02-03 NOTE — Assessment & Plan Note (Signed)
Improved Hasn't needed the trazodone lately

## 2014-02-03 NOTE — Progress Notes (Signed)
Subjective:    Patient ID: Toni Henry, female    DOB: [birth year], 80 y.o.   MRN: [id number]  HPI "Okay except from the neck up" Has lots of snapping noise in head with turning Pain in back of neck--- relates to fall last year where she banged her head No recent falls  Uses aleve once or twice a week-- 2 a day and this will help Has tried heat--not much help Hasn't tried tylenol Does try to do ROM exercises---hurts some days and not on others  No arm weakness No arm numbness or tingling Notices faint weakness in legs---no change in function  Sleeping okay Not using the trazodone but keeps it prn use  Has dry mouth Tries chewing gum and will increase water---helps some No major dry eyes  Acid reflux has been quiet Slight swallowing issues--- has to take pills one at a time. No problems with food  Current Outpatient Prescriptions on File Prior to Visit  Medication Sig Dispense Refill  . Cholecalciferol (VITAMIN D3) 2000 UNITS TABS Take 1 tablet by mouth daily.      . sennosides-docusate sodium (SENOKOT-S) 8.6-50 MG tablet Take 2 tablets by mouth as needed for constipation.       . traZODone (DESYREL) 50 MG tablet Take 25-50 mg by mouth at bedtime as needed for sleep.       No current facility-administered medications on file prior to visit.    No Known Allergies  Past Medical History  Diagnosis Date  . GERD (gastroesophageal reflux disease)   . Allergic rhinitis due to pollen   . Macular degeneration   . Osteoporosis     Past Surgical History  Procedure Laterality Date  . Total hip arthroplasty Right ~2010  . Hip fracture surgery Left 1/14  . Abdominal hysterectomy      No family history on file.  History   Social History  . Marital Status: Widowed    Spouse Name: N/A    Number of Children: 2  . Years of Education: N/A   Occupational History  . Schoolteacher     Retired   Social History Main Topics  . Smoking status: Never Smoker   . Smokeless  tobacco: Never Used  . Alcohol Use: Yes  . Drug Use: No  . Sexual Activity: Not on file   Other Topics Concern  . Not on file   Social History Narrative   Widowed ~2012   2 daughters      Has living will   Designated SIL Richard as health care POA   Requests DNR--form done 11/12/12   Would not accept feeding tube   Review of Systems Nose is dry also--uses nasal spray Bowels okay Mild depressed mood at times-- "I just give myself a talking to". No anhedonia     Objective:   Physical Exam  Constitutional: She appears well-developed and well-nourished. No distress.  HENT:  Mouth/Throat: No oropharyngeal exudate.  Slight dry mucous membranes Teeth look okay  Neck: No thyromegaly present.  Decreased extension and tilt Fair flexion and only mild decreased rotation Some tightness in trapezius muscles  Cardiovascular: Normal rate, regular rhythm and normal heart sounds.  Exam reveals no gallop.   No murmur heard. Pulmonary/Chest: Effort normal and breath sounds normal. No respiratory distress. She has no wheezes. She has no rales.  Abdominal: Soft. There is no tenderness.  Musculoskeletal: She exhibits no edema and no tenderness.  Lymphadenopathy:    She has no cervical adenopathy.  Psychiatric: She  has a normal mood and affect. Her behavior is normal.          Assessment & Plan:

## 2014-02-03 NOTE — Assessment & Plan Note (Signed)
Has usually been 110# Discussed adding ensure

## 2014-02-03 NOTE — Assessment & Plan Note (Signed)
Seems to be better Not using meds

## 2014-02-03 NOTE — Progress Notes (Signed)
Pre visit review using our clinic review tool, if applicable. No additional management support is needed unless otherwise documented below in the visit note. 

## 2014-02-03 NOTE — Patient Instructions (Signed)
Please add ensure--- 1-2 cans per day Please try extended release acetaminophen (tylenol) every day--- 2 or 3 times per day

## 2014-02-03 NOTE — Assessment & Plan Note (Signed)
Will add tylenol arthritis

## 2014-02-03 NOTE — Assessment & Plan Note (Signed)
Doesn't look too bad No anticholinergics Nothing to suggest sicca syndrome Discussed supportive care and keep up with dentist

## 2014-03-05 ENCOUNTER — Encounter: Payer: Medicare Other | Admitting: Internal Medicine

## 2014-03-12 ENCOUNTER — Encounter: Payer: Self-pay | Admitting: Family Medicine

## 2014-03-12 ENCOUNTER — Ambulatory Visit (INDEPENDENT_AMBULATORY_CARE_PROVIDER_SITE_OTHER): Payer: Medicare Other | Admitting: Family Medicine

## 2014-03-12 VITALS — BP 110/74 | HR 64 | Temp 97.6°F | Wt 99.2 lb

## 2014-03-12 DIAGNOSIS — R040 Epistaxis: Secondary | ICD-10-CM | POA: Diagnosis not present

## 2014-03-12 NOTE — Progress Notes (Signed)
Pre visit review using our clinic review tool, if applicable. No additional management support is needed unless otherwise documented below in the visit note.  Last night she coughed and noted some blood in the sputum.  Hadn't gone on before last night.  She feels well, at baseline, normal.  She had pulled a muscle in her back moving some furniture, but o/w feels well.  Taking tylenol for back pain with some relief.  No FCNAVD. No rhinorrhea.  No ear pain now.  No ST.  No cough other than last night.  No other bleeding or bruising noted.  Not on ASA. Didn't use humidifier last night.   Meds, vitals, and allergies reviewed.   ROS: See HPI.  Otherwise, noncontributory.  nad ncat Tm wnl Nasal exam wnl except for scant scabbed blood in L nostril.  OP wnl Neck supple, no LA rrr ctab

## 2014-03-12 NOTE — Patient Instructions (Signed)
Use the humidifier.  I think you had a self-limited nose bleed last night.

## 2014-03-14 DIAGNOSIS — R040 Epistaxis: Secondary | ICD-10-CM | POA: Insufficient documentation

## 2014-03-14 NOTE — Assessment & Plan Note (Signed)
Incidental, should resolve, likely caused the appearance of nocturnal hemoptysis.  Routine cautions given. F/u prn.  No sign of ominous dx.  Routed to PCP as FYI.

## 2014-11-22 ENCOUNTER — Other Ambulatory Visit: Payer: Self-pay

## 2014-11-22 NOTE — Telephone Encounter (Signed)
Find out if she is taking the trazodone If not, have her restart (send Rx if she needs it) If she is already taking, it would be safe to double the dose while having more problems. If she has active shingles, she should probably be seen to initiate anti-viral medication

## 2014-11-22 NOTE — Telephone Encounter (Signed)
Pt left v/m requesting sleeping pill to take while pt has shingles. Trazodone is on pts med list. Pt last seen for sick visit 03/12/14; last f/u appt 05/28/2013. Pt is at [facility].pt request cb.

## 2014-11-23 NOTE — Telephone Encounter (Signed)
Spoke with patient and she doesn't need the medication.

## 2014-12-17 NOTE — Op Note (Signed)
PATIENT NAME:  Toni Henry, Toni Henry MR#:  [id number] DATE OF BIRTH:  [birth year]  DATE OF PROCEDURE:  09/23/2012  PREOPERATIVE DIAGNOSIS:  Left comminuted intertrochanteric hip fracture.   POSTOPERATIVE DIAGNOSIS:  Left comminuted intertrochanteric hip fracture.   PROCEDURE PERFORMED:  Fixation of left comminuted intertrochanteric hip fracture with Biomet AFFIXUS fracture nail.   SURGEON:  Dawayne Patricia, MD   ASSISTANT:  None.   ESTIMATED BLOOD LOSS:  300 mL.   URINE OUTPUT:  225 mL.   IV FLUIDS:  1600 mL.   IMPLANTS:  Biomet AFFIXUS hip fracture nail, left side, 130 degrees, 11 mm x 360 mm; lag screw 10.5 mm x 100 mm; antirotation screw 80 mm; cortical bone screw for distal interlock 5 x 46 mm.   COMPLICATIONS:  No immediate intraoperative or postoperative complications noted.   DISPOSITION:  The patient will be transferred back to the floor. She will be weightbearing as tolerated.   INDICATIONS FOR PROCEDURE:  The patient is a 81 year old patient who presented to the Emergency Room after sustaining a fall while attempting to catch a falling bag. She landed directly onto her left side sustaining a comminuted intertrochanteric hip fracture. The pattern of fracture was deemed to be unstable. The decision was made to proceed with a trochanteric reconstruction nail.   Discussion was had with both the patient and her daughter, Izora Gala. All risks and benefits of surgery were explained to the patient. Informed consent was obtained. Operative site was marked.   DESCRIPTION OF PROCEDURE:  The patient was identified in the preoperative holding area. Site marking was confirmed. She was brought into the operating room where spinal anesthesia was administered. The patient was placed on a fracture table. Her right nonoperative leg was placed in a well-leg holder. Her left leg was placed in the traction boot setup. Fluoro was brought in to confirm adequate reduction of the fracture on orthogonal radiographs.    The left hip was prepared and draped in the usual sterile fashion. A timeout was performed identifying the patient, the operative site, the procedure to be performed, confirming that images shown belong to this patient and confirming that preoperative antibiotics by way of intravenous Ancef were administered.   The tip of the greater trochanter was marked using fluoroscopy. An incision was made approximately 3 fingerbreadths proximal to the tip of the trochanter. A guidepin was inserted through the incision and placed directly on the tip of the trochanter. The guidepin was drilled into the proximal aspect of the femur. Placement of the pin was confirmed on orthogonal radiographs. Once desirable pin placement was achieved, an opening cortical reamer was inserted. The reamer was inserted and taken down to the level of the lesser trochanter. At this time, a ball-tipped guidewire was inserted and passed down to the level of the distal fascial scar. Sequential reaming was begun and progressed to 13 mm. The length of the ball-tipped guidewire was measured to be approximately 370 mm. Therefore, we decided to proceed with a 360 mm nail.   A 360 mm x 11 mm AFFIXUS long nail was inserted over the ball-tipped guidewire. Confirmation of placement of the nail was confirmed on orthogonal radiographs both at the hip and the knee. The nail was taken down to the level where the lag hip screw hole was at an appropriate level. Using the provided guide and drill sleeve, an incision was made for placement of the lag screw. Pin was placed through the guide and when desirable position on both AP and lateral  radiographs was noted in the femoral head, this was measured to be 100 mm. The hip pin was then overdrilled to 100 mm and a 100 mm x 10.5 mm lag screw was inserted. Please note that prior to drilling the lag screw, a second pin was placed through the antirotation screw hole.   The antirotation pin was then measured to be 80  mm. Prior to drilling and placing the antirotation screw, the hip fracture was compressed until adequate compression was achieved on radiographs. At this time, the antirotation pin was removed and the hole was drilled. An 80 mm antirotation screw was placed. The set screw was now depressed into the nail and loosened by a quarter turn. The lag screw insertion guide was then removed.   Attention was now turned to the distal femur. Using a standard perfect circle technique, a distal static interlock screw measuring 46 mm x 5 mm was inserted. Placement was confirmed on orthogonal radiographs. Final radiographs were taken at the hip and the knee confirming final placement and reduction of our fracture.   All wounds were copiously irrigated. Fascia was closed using 0 Vicryl suture, subcutaneous tissue was closed using 2-0 Vicryl suture and skin was closed using staples. Sterile dressings were applied. The patient was removed from traction and the well-leg holder. She was transferred to the postoperative bed in stable condition. She will be weightbearing as tolerated. She will be seen by physical therapy tomorrow. She will be placed on Lovenox for DVT prophylaxis as well as on 24 hours of prophylactic antibiotics.    ____________________________ Dawayne Patricia, MD sr:si D: 09/24/2012 15:41:50 ET T: 09/24/2012 16:38:31 ET JOB#: [id number]  cc: Dawayne Patricia, MD, <Dictator> Dawayne Patricia MD ELECTRONICALLY SIGNED 11/04/2012 11:44

## 2014-12-17 NOTE — Consult Note (Signed)
1. pre-opno contraindications to surgery at this time, proceed with surgeryimpaired fasting glucosemost likely from stress reaction from fallproabable GERDstart omperazoleWieting   Electronic Signatures: Loletha Grayer (MD) (Signed on 27-Jan-14 16:02)  Authored   Last Updated: 27-Jan-14 16:02 by Loletha Grayer (MD)

## 2014-12-17 NOTE — Discharge Summary (Signed)
PATIENT NAME:  Toni Henry, Toni Henry MR#:  [id number] DATE OF BIRTH:  12-Jul-1920  DATE OF ADMISSION:  09/22/2012 DATE OF DISCHARGE:  09/26/2012   ADMITTING DIAGNOSIS: Left intertrochanteric fracture.  DISCHARGE DIAGNOSIS: Left intertrochanteric fracture.  OPERATION: On 09/23/2012, she had an intramedullary nail of the left IT hip fracture.   SURGEON: Dawayne Patricia, MD  ANESTHESIA: Spinal.   ESTIMATED BLOOD LOSS: 300 mL.  FLUIDS REPLACED: 1600 crystalloid.   OPERATIVE FINDINGS: Intertrochanteric hip fracture, left.   IMPLANTS: [employer]omet Affixus hip fracture nail, left side, 130 degrees, 11 mm x 360 mm, lag screw 10.5 mm x 100 mm, antirotational screw 80 mm, cortical bone screw for distal interlock 5 x 46 mm.   COMPLICATIONS: None.    HISTORY: The patient is a 81 year old female who sustained injury on 09/22/2012 when she was walking with a bag stacked on a rollabout. The bag was falling and she attempted to catch it and fell. She had severe left hip pain and presented to [facility] on the same day where x-rays confirmed a left intertrochanteric fracture.   PHYSICAL EXAMINATION:  GENERAL: Well-developed, well-nourished, no acute distress.  HEENT: Pink conjunctivae. Hearing intact to voice. Most moist oral mucosa. Slightly hard of hearing.  NECK: Supple. No masses. Thyroid nontender. Trachea midline.  RESPIRATORY: Normal respiratory effort. No accessory muscles. CARDIOVASCULAR: Regular rate and rhythm. No thrills. No lower extremity edema.  ABDOMEN: Denies tenderness, denies flank tenderness, no abdominal mass.  GENITOURINARY: Foley catheter in place, clear yellow urine draining. No suprapubic tenderness.  LYMPHATIC: No inguinal lymphadenopathy.  EXTREMITIES: Short and externally rotated. TA, GS, EHL 5/5. SPN, DPN and tibial nerve intact. DP pulses +2. Cap refill less than 2 seconds.  SKIN: Normal to palpation. No ulcers.  NEUROLOGIC: Negative Babinski.  PSYCHIATRIC: Alert and oriented x 3.    HOSPITAL COURSE: After admission on 09/22/2012, the patient underwent nailing of left intertrochanteric fracture on 09/23/2012. Her hemoglobin on postoperative day 1 on 09/23/2012 was 10.1 and hematocrit 29.4. She tolerated physical therapy well that day. On postoperative day 2, her dressing was changed and she continued to have physical therapy. On postoperative day 2 on 09/24/2012, her hemoglobin dropped to 7.1. She was transfused 1 unit of packed red blood cells. Repeat hemoglobin was 6.5. On postoperative day 3 on 09/26/2012, her repeat hemoglobin was 7.4. She has been tolerating physical therapy and is stable and ready be discharged to rehab.   CONDITION AT DISCHARGE: Stable.   DISPOSITION: The patient was sent to rehab.   DISCHARGE INSTRUCTIONS:  The patient will follow up with [facility] in 2 weeks for staple removal.  She will do physical therapy and may be weight-bearing as tolerated on the left lower extremity.  Her diet will be regular.  She will use TED hose knee-high bilaterally.  An abduction pillow will be used when in bed and in the chair. Dressing can be changed once daily on as-needed basis.   DISCHARGE MEDICATIONS:  1.  Alka-Seltzer 2 tabs orally every 12 hours as needed for indigestion, heartburn.  2.  Acetaminophen/hydrocodone 325/5, 1 tab p.o. q.4-6 hours p.r.n. pain.  3.  Magnesium hydroxide 8% oral suspension 30 mL p.o. at bedtime p.r.n. constipation.  4.  Enoxaparin 30 mg subcutaneous once daily.  5.  Scopolamine 1 patch topically every 3 days.  6.  Zofran 4 mg p.o. q.4 hours p.r.n. nausea and vomiting.  7.  Ferrous sulfate 325 mg tablet 1 tab p.o. b.i.d. with meals.  8.  Bisacodyl 10 mg  rectal suppository, 1 suppository rectal once daily at bedtime as needed constipation.  9.  Docusate/calcium 240 mg oral capsule, 1 cap p.o. at bedtime.  10.  Calcium/vitamin D 500/200 international units 1 tab p.o. b.i.d. with meals.  11.  Cholecalciferol  (vitamin D3) 2000 units p.o. once daily.  12.  Acetaminophen 325, 2 tabs p.o. q.4 hours p.r.n. temperature greater than 100.5.    ____________________________ Andrianna Manalang M. Tretha Sciara, NP amb:jm D: 09/26/2012 12:59:37 ET T: 09/26/2012 13:17:17 ET JOB#: [id number]  cc: Keonta Monceaux M. Tretha Sciara, NP, <Dictator> Kem Kays Cameryn Chrisley FNP ELECTRONICALLY SIGNED 10/21/2012 11:02

## 2014-12-17 NOTE — H&P (Signed)
Subjective/Chief Complaint Left hip fracture    History of Present Illness Patient was walking with a bag stacked on a roll about.  The bag was falling so the patient attempted to cathc it and fell.  No loss of consciousness.  She did hit her head - CT negative.    SHe complains on left hip pain.  Denies pain elsewhere. Denies preexisting hip pain.  Denies numbness and tingling.  Had a total hip arthro. by Dr. Nilsa Nutting in [place] three years ago with good results.  Lives independently in an assisted living. No assisted device used on daily basis.    Past History Denies.  Notes history of severe hypotension with anesthesia from THA.   Past Med/Surgical Hx:  RIGHT HIP REPLACMENT:   ALLERGIES:  No Known Allergies:   HOME MEDICATIONS: Medication Instructions Status  Aleve 220 mg oral tablet 1 tab(s) orally every 8 hours, As Needed- for Pain  Active  Alka-Seltzer 2 tab(s) orally every 12 hours, As Needed- for Indigestion, Heartburn  Active   Family and Social History:   Family History Non-Contributory    Social History negative tobacco, negative ETOH, negative Illicit drugs    Place of Living assisted living   Review of Systems:   Subjective/Chief Complaint left hip pain    Fever/Chills No    Cough No    Sputum No    Abdominal Pain No    Diarrhea No    Constipation No    Nausea/Vomiting No    SOB/DOE No    Chest Pain No    Dysuria No    Medications/Allergies Reviewed Medications/Allergies reviewed   Physical Exam:   GEN well developed, well nourished, no acute distress    HEENT pink conjunctivae, hearing intact to voice, moist oral mucosa, slightly hard of hearing    NECK supple  No masses  thyroid not tender  trachea midline    RESP normal resp effort  no use of accessory muscles    CARD regular rate  no thrills  No LE edema    ABD denies tenderness  denies Flank Tenderness  no Adominal Mass    GU foley catheter in place  clear yellow urine  draining  no superpubic tenderness    LYMPH no inguinal lymphadenopathy    EXTR short and externally rotated. TA, GS, EHL5/5.  SPN, DPN, and tib nerve intact. DP 1+  Cap refill <2secs    SKIN normal to palpation, No ulcers    NEURO negative Babinski R    PSYCH alert, A+O to time, place, person, good insight   Radiology Results: XRay:    27-Jan-14 12:56, Pelvis AP Only   Pelvis AP Only   PRELIMINARY REPORT    The following is a PRELIMINARY Radiology report.  A final report will follow pending radiologist verification.      REASON FOR EXAM:    preop  COMMENTS:       PROCEDURE: DXR - DXR PELVIS AP ONLY- Sep 22 2012 12:56PM     RESULT:     Findings: Comminuted intertrochanteric fracture is identified on the   left. A right hip prosthesis is appreciated which appears  unremarkable.    IMPRESSION:  Comminuted intertrochanteric fracture on the left.      Thank you for this opportunity to contribute to the care of your patient.     Dictated By: Mikki Santee, M.D., MD    27-Jan-14 13:11, Chest 1 View AP or PA   Chest  1 View AP or PA   REASON FOR EXAM:    preoperative  COMMENTS:       PROCEDURE: DXR - DXR CHEST 1 VIEWAP OR PA  - Sep 22 2012  1:11PM     RESULT: The lungs are clear. The cardiac silhouette and visualized bony   skeleton are unremarkable.    IMPRESSION:      1. Chest radiograph without evidence of acute cardiopulmonary disease.   2. Comparison made to prior study dated 03/09/2011.        Verified By: Mikki Santee, M.D., MD    27-Jan-14 13:11, Hip Left Complete   Hip Left Complete   PRELIMINARY REPORT    The following is a PRELIMINARY Radiology report.  A final report will follow pending radiologist verification.      REASON FOR EXAM:    FALL, HIP PAIN  COMMENTS:       PROCEDURE: DXR - DXR HIP LEFT COMPLETE  - Sep 22 2012  1:11PM     RESULT:     Findings: Comminuted intertrochanteric left hip fracture is appreciated.   There is a  component of superior angulation and medial angulation of the   distal fracture fragment.     IMPRESSION:  Impacted comminuted intertrochanteric left hip fracture.      Thank you for this opportunity to contribute to the care of your patient.     Dictated By: Mikki Santee, M.D., MD  CT:    27-Jan-14 13:45, CT Head Without Contrast   CT Head Without Contrast   REASON FOR EXAM:    head injury  COMMENTS:       PROCEDURE: CT  - CT HEAD WITHOUT CONTRAST  - Sep 22 2012  1:45PM     RESULT: Emergent noncontrast CT of the brain demonstrates prominence of   the ventricles and sulci. Extensive low-attenuation is seen in the   periventricular and subcortical white matter. There is no intracranial   hemorrhage, mass or mass effect. No evolving infarct is evident. The   orbits are unremarkable. The paranasal sinuses and mastoid air cells show   normal aeration. Thecalvarium shows no depressed skull fracture.    IMPRESSION:   1. Changes of atrophy and chronic microvascular ischemic disease. No   acute intracranial abnormality evident.   Dictation Site: 1        Verified By: Sundra Aland, M.D., MD     Assessment/Admission Diagnosis Left comminuted intertrochanteric fracture.  Spoken with Dr. Earleen Newport.  Patient maximized for surgery tomorrow. Plan for IT nail of the femur.  NPO after midnight. Bedrest. [employer].   Electronic Signatures: Dawayne Patricia (MD)  (Signed 27-Jan-14 18:04)  Authored: CHIEF COMPLAINT and HISTORY, PAST MEDICAL/SURGIAL HISTORY, ALLERGIES, HOME MEDICATIONS, FAMILY AND SOCIAL HISTORY, REVIEW OF SYSTEMS, PHYSICAL EXAM, Radiology, ASSESSMENT AND PLAN   Last Updated: 27-Jan-14 18:04 by Dawayne Patricia (MD)

## 2014-12-17 NOTE — Consult Note (Signed)
PATIENT NAME:  Toni Henry, Toni Henry MR#:  [id number] DATE OF BIRTH:  Nov 30, 1919  DATE OF CONSULTATION:  09/22/2012  REFERRING PHYSICIAN:  Dawayne Patricia, MD.  CONSULTING PHYSICIAN:  Tana Conch. Leslye Peer, MD.  PRIMARY CARE PHYSICIAN: Ocie Cornfield. Ouida Sills, MD.   REASON FOR CONSULTATION: Preop and postop management of hip fracture.   HISTORY OF PRESENT ILLNESS: This is a 81 year old female who was going to the train station to go visit grandchildren. She had her rolling luggage and a bag on top of that. The bag on top of the rolling luggage slipped off. She tried to catch it, and she had a fall. She knew she broke her hip right away. She could not move it. Pain was 10 out of 10 in intensity initially, after pain medications down to 5 out of 10 in intensity. The patient does not complain of any chest pain or shortness of breath.   Of note, in the past when she had the right hip replacement, she had trouble coming out of anesthesia and was hypotensive. I think that was under general anesthesia.   PAST MEDICAL HISTORY: Macular degeneration.   PAST SURGICAL HISTORY: Right hip replacement and hysterectomy.   ALLERGIES: No known drug allergies.   MEDICATIONS: Include p.r.n. multivitamin, Lutein, occasional iron, occasional Aleve, occasional Benadryl, Alka-Seltzer Cold Remedy.   SOCIAL HISTORY: Drinks 1 glass of wine per day. No smoking. No drug use. Lives alone at [facility] independent living. 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   FAMILY HISTORY: Father died at 72 of an MI. Mother died at 50 of a lung-related issue.   REVIEW OF SYSTEMS:  CONSTITUTIONAL: No fever, chills or sweats. No weight loss. No weight gain. No weakness or fatigue.  EYES: She does wear glasses. Has macular degeneration.  EARS, NOSE, MOUTH AND THROAT: Decreased hearing, wears hearing aids. Positive for runny nose. Occasional dysphagia.  CARDIOVASCULAR: No chest pain. No palpitations.  RESPIRATORY: Sometimes, shortness of  breath with lying down. Sometimes, short of breath with walking 1/2 mile.  GASTROINTESTINAL: No nausea. No vomiting. Sometimes has gas pain. No diarrhea. No constipation. No bright red blood per rectum. No melena.  GENITOURINARY: No burning on urination or hematuria.  MUSCULOSKELETAL: Positive for left hip pain.  INTEGUMENT: No rashes or eruptions.  NEUROLOGIC: No fainting or blackouts.  PSYCHIATRIC: No anxiety or depression.  ENDOCRINE: No thyroid problems.  HEMATOLOGIC AND LYMPHATIC: History of anemia in the past.   PHYSICAL EXAMINATION:  VITAL SIGNS: Temperature 97, pulse 66, respirations 18, blood pressure 122/54, pulse ox 92% on room air.  GENERAL: No respiratory distress.  EYES: Conjunctivae and lids normal. Pupils equal, round and reactive to light. Extraocular muscles intact. No nystagmus.  EARS, NOSE, MOUTH AND THROAT: Tympanic membrane blocked by wax. Nasal mucosa: No erythema. Throat: No erythema. No exudate seen. Lips and gums: No lesions.  NECK: No JVD. No bruits. No lymphadenopathy. No thyromegaly. No thyroid nodules palpated.  RESPIRATORY: Lungs clear to auscultation. No use of accessory muscles to breathe. No rhonchi, rales or wheeze heard.  CARDIOVASCULAR SYSTEM: S1, S2 normal. Positive 2/6 systolic ejection murmur. Carotid upstroke 2+ bilaterally. No bruits.  EXTREMITIES: Dorsalis pedis pulses 2+ bilaterally. No edema of the lower extremities.  ABDOMEN: Soft, nontender. No organo- or splenomegaly. Normoactive bowel sounds. No masses felt.  LYMPHATIC: No lymph nodes in the neck.  MUSCULOSKELETAL: Left leg shortened and externally rotated.  SKIN: No rash or ulcers seen.  NEUROLOGIC: Cranial nerves II through XII grossly intact. Did  not test deep tendon reflexes secondary to hip fracture.  PSYCHIATRIC: The patient is oriented to person, place and time.   LABORATORY AND RADIOLOGICAL DATA: White blood cell count 8.5, H and H 12.5 and 36.6, platelet count of 164. Glucose 123,  BUN 18, creatinine 0.95, sodium 140, potassium 4.3, chloride 106, CO2 24, calcium 8.7. Liver function tests normal range. CT SCAN OF THE HEAD: Changes of atrophy and chronic microvascular ischemic disease. No evidence of acute intracranial abnormality. CHEST X-RAY: No acute cardiopulmonary disease. EKG showed normal sinus rhythm, 68 beats per minute. Flattening of T waves laterally.   ASSESSMENT AND PLAN:  1. Preop consultation for a left hip fracture. I see no contraindications to surgery at this time; can proceed with surgery. The patient is a low risk for a low risk procedure. The patient must go to surgery if she wants to walk again as soon as possible to avoid complications such as skin breakdown, deep vein thrombosis, bronchitis and pneumonia. The patient and family are willing to proceed with surgery.  2. Impaired fasting glucose: I believe this is probably secondary to stress response since she had a fracture.  3. Likely gastroesophageal reflux disease: Will prescribe omeprazole 20 mg twice a day.  4. GFR is 53. That goes along with chronic kidney disease stage II, and this is chronic.   Of note, the patient did have prior hypotension after her right hip replacement and trouble coming out of anesthesia previously.   The patient is a DNR.   TIME SPENT ON CONSULTATION: 55 minutes.    ____________________________ Tana Conch. Leslye Peer, MD rjw:gb D: 09/22/2012 16:10:32 ET T: 09/22/2012 22:07:55 ET JOB#: [id number]  cc: Tana Conch. Leslye Peer, MD, <Dictator> Ocie Cornfield. Ouida Sills, MD Dawayne Patricia, MD  Marisue Brooklyn MD ELECTRONICALLY SIGNED 09/26/2012 16:12

## 2015-01-14 DIAGNOSIS — H1851 Endothelial corneal dystrophy: Secondary | ICD-10-CM | POA: Diagnosis not present

## 2015-06-20 DIAGNOSIS — L720 Epidermal cyst: Secondary | ICD-10-CM | POA: Diagnosis not present

## 2015-06-20 DIAGNOSIS — C44729 Squamous cell carcinoma of skin of left lower limb, including hip: Secondary | ICD-10-CM | POA: Diagnosis not present

## 2015-06-23 DIAGNOSIS — Z23 Encounter for immunization: Secondary | ICD-10-CM | POA: Diagnosis not present

## 2016-01-16 ENCOUNTER — Encounter: Payer: Self-pay | Admitting: Internal Medicine

## 2016-01-16 ENCOUNTER — Ambulatory Visit (INDEPENDENT_AMBULATORY_CARE_PROVIDER_SITE_OTHER)
Admission: RE | Admit: 2016-01-16 | Discharge: 2016-01-16 | Disposition: A | Discharge status: Home / Self Care | Payer: Medicare Other | Source: Ambulatory Visit | Attending: Internal Medicine | Admitting: Internal Medicine

## 2016-01-16 ENCOUNTER — Telehealth: Payer: Self-pay | Admitting: Internal Medicine

## 2016-01-16 ENCOUNTER — Ambulatory Visit (INDEPENDENT_AMBULATORY_CARE_PROVIDER_SITE_OTHER): Payer: Medicare Other | Admitting: Internal Medicine

## 2016-01-16 VITALS — BP 100/60 | HR 83 | Temp 98.7°F | Resp 18 | Wt 99.5 lb

## 2016-01-16 DIAGNOSIS — E441 Mild protein-calorie malnutrition: Secondary | ICD-10-CM

## 2016-01-16 DIAGNOSIS — R05 Cough: Secondary | ICD-10-CM

## 2016-01-16 DIAGNOSIS — R053 Chronic cough: Secondary | ICD-10-CM | POA: Insufficient documentation

## 2016-01-16 NOTE — Progress Notes (Signed)
Pre visit review using our clinic review tool, if applicable. No additional management support is needed unless otherwise documented below in the visit note. 

## 2016-01-16 NOTE — Telephone Encounter (Signed)
Patient Name: Toni Henry  DOB: November 14, 1919    Initial Comment Caller says she has walking pna, has a lot of mucus coming up, she is short of breath   Nurse Assessment  Nurse: Leilani Merl, RN, Nira Conn Date/Time (Eastern Time): 01/16/2016 8:49:51 AM  Confirm and document reason for call. If symptomatic, describe symptoms. You must click the next button to save text entered. ---Caller states that she has a lot of mucus coming up, she is short of breath. This started about 4 months ago and it is getting worse.  Has the patient traveled out of the country within the last 30 days? ---Not Applicable  Does the patient have any new or worsening symptoms? ---Yes  Will a triage be completed? ---Yes  Related visit to physician within the last 2 weeks? ---No  Does the PT have any chronic conditions? (i.e. diabetes, asthma, etc.) ---Yes  List chronic conditions. ---See MR  Is this a behavioral health or substance abuse call? ---No     Guidelines    Guideline Title Affirmed Question Affirmed Notes  Cough - Acute Productive Wheezing is present    Final Disposition User   See Physician within 4 Hours (or PCP triage) Standifer, RN, [place] states that she wants to try to be worked in to the office this morning if possible. She does not want to go to the ED or [facility].   Referrals  REFERRED TO PCP OFFICE   Disagree/Comply: Disagree  Disagree/Comply Reason: Disagree with instructions

## 2016-01-16 NOTE — Assessment & Plan Note (Addendum)
Not acutely ill and doesn't seem to have clear infectious illness May be developing variant of chronic bronchitis May have sputum due to meds/etc she takes to increase saliva CXR shows hyperinflation but no infiltrate Reassured  I suspect early chronic bronchitis--- no Rx indicated unless she has more SOB She has a follow up in a month-will review status at that time

## 2016-01-16 NOTE — Progress Notes (Signed)
   Subjective:    Patient ID: Toni Henry, female    DOB: [birth year], 82 y.o.   MRN: [id number]  HPI Here for respiratory illness  Has dry mouth still  Goes back years but worse now Using over the counter stuff--biotene----some help Uses insert in gum at night--helps keep moisture so she can sleep Teeth okay--keeps up with dentist  Also concerned about "walking pneumonia" Has been coughing a lot Light gray or brown sputum No head congestion Goes back 6 months or so Some SOB--no sig change. None in past couple of weeks Intermittent wheezing Only called now due to daughter's concern No meds for cough No cigarette smoking or exposure in the past No difference based on season--no pollen issues  Eats okay Weight is stable---tries to gain but not able  Current Outpatient Prescriptions on File Prior to Visit  Medication Sig Dispense Refill  . acetaminophen (TYLENOL) 650 MG CR tablet Take 650 mg by mouth every 8 (eight) hours as needed.     . Cholecalciferol (VITAMIN D3) 2000 UNITS TABS Take 1 tablet by mouth daily.    . Lutein 10 MG TABS Take 1 tablet by mouth daily.    . Multiple Vitamin (MULTIVITAMIN) capsule Take 1 capsule by mouth daily.     No current facility-administered medications on file prior to visit.    No Known Allergies  Past Medical History  Diagnosis Date  . GERD (gastroesophageal reflux disease)   . Allergic rhinitis due to pollen   . Macular degeneration   . Osteoporosis     Past Surgical History  Procedure Laterality Date  . Total hip arthroplasty Right ~2010  . Hip fracture surgery Left 1/14  . Abdominal hysterectomy      No family history on file.  Social History   Social History  . Marital Status: Widowed    Spouse Name: N/A  . Number of Children: 2  . Years of Education: N/A   Occupational History  . Schoolteacher     Retired   Social History Main Topics  . Smoking status: Never Smoker   . Smokeless tobacco: Never Used  . Alcohol  Use: Yes  . Drug Use: No  . Sexual Activity: Not on file   Other Topics Concern  . Not on file   Social History Narrative   Widowed ~2012   2 daughters      Has living will   Designated SIL Riaan Toledo as health care POA   Requests DNR--form done 11/12/12   Would not accept feeding tube   Review of Systems No headache No rash No vomiting or diarrhea No falls No depression or anhedonia. Tries to [employer] with Legrand Como the fitness director    Objective:   Physical Exam  Constitutional: She appears well-developed and well-nourished. No distress.  Slight cough  HENT:  Mouth/Throat: Oropharynx is clear and moist. No oropharyngeal exudate.  Neck: Normal range of motion. Neck supple. No thyromegaly present.  Pulmonary/Chest: Effort normal and breath sounds normal. No respiratory distress. She has no wheezes. She has no rales.  Lymphadenopathy:    She has no cervical adenopathy.          Assessment & Plan:

## 2016-01-16 NOTE — Assessment & Plan Note (Signed)
Weight is at least stable She tries to increase eating but can't gain weight Can try supplements

## 2016-01-16 NOTE — Telephone Encounter (Signed)
Will see then. 

## 2016-01-16 NOTE — Telephone Encounter (Signed)
I spoke to patient and she scheduled appointment with Dr.Letvak at 11:00.

## 2016-01-16 NOTE — Telephone Encounter (Signed)
Please check with her this morning Can offer 11AM---that appt person is in hospital

## 2016-01-19 ENCOUNTER — Telehealth: Payer: Self-pay | Admitting: Internal Medicine

## 2016-01-19 ENCOUNTER — Ambulatory Visit (INDEPENDENT_AMBULATORY_CARE_PROVIDER_SITE_OTHER): Payer: Medicare Other | Admitting: Internal Medicine

## 2016-01-19 ENCOUNTER — Encounter: Payer: Self-pay | Admitting: Internal Medicine

## 2016-01-19 VITALS — BP 132/70 | HR 73 | Temp 98.3°F | Resp 24 | Wt 102.0 lb

## 2016-01-19 DIAGNOSIS — J439 Emphysema, unspecified: Secondary | ICD-10-CM | POA: Insufficient documentation

## 2016-01-19 DIAGNOSIS — J4520 Mild intermittent asthma, uncomplicated: Secondary | ICD-10-CM

## 2016-01-19 MED ORDER — AMOXICILLIN 500 MG PO TABS
1000.0000 mg | ORAL_TABLET | Freq: Two times a day (BID) | ORAL | Status: DC
Start: 2016-01-19 — End: 2016-02-07

## 2016-01-19 MED ORDER — PREDNISONE 20 MG PO TABS: 40.0000 mg | ORAL_TABLET | Freq: Every day | ORAL | Status: DC

## 2016-01-19 MED ORDER — ALBUTEROL SULFATE HFA 108 (90 BASE) MCG/ACT IN AERS: 2.0000 | INHALATION_SPRAY | Freq: Four times a day (QID) | RESPIRATORY_TRACT | Status: DC | PRN

## 2016-01-19 NOTE — Telephone Encounter (Signed)
Pt has appt to see Dr Silvio Pate 01/19/16 at 4:15.

## 2016-01-19 NOTE — Telephone Encounter (Signed)
Will reeval at visit

## 2016-01-19 NOTE — Telephone Encounter (Signed)
[facility] Patient Name: Toni Henry DOB: 1920/05/23 Initial Comment has shortness of breath attacks that come up here and there, saw dr. Adelfa Koh but he didnt prescribe anything Nurse Assessment Nurse: Dimas Chyle, RN, Dellis Filbert Date/Time Eilene Ghazi Time): 01/19/2016 8:29:51 AM Confirm and document reason for call. If symptomatic, describe symptoms. You must click the next button to save text entered. ---Has shortness of breath attacks that come up here and there, saw dr. Monday but he didn't prescribe anything. Has the patient traveled out of the country within the last 30 days? ---No Does the patient have any new or worsening symptoms? ---Yes Will a triage be completed? ---Yes Related visit to physician within the last 2 weeks? ---Yes Does the PT have any chronic conditions? (i.e. diabetes, asthma, etc.) ---No Is this a behavioral health or substance abuse call? ---No Guidelines Guideline Title Affirmed Question Affirmed Notes Breathing Difficulty [1] MODERATE longstanding difficulty breathing (e.g., speaks in phrases, SOB even at rest, pulse 100-120) AND [2] SAME as normal Final Disposition User See PCP When Office is Open (within 3 days) Dimas Chyle, RN, [employer] Referrals REFERRED TO PCP OFFICE Disagree/Comply: Leta Baptist

## 2016-01-19 NOTE — Patient Instructions (Addendum)
Please try over the counter guaifenesin to loosen the cough. Sleep with your head up Try the albuterol inhaler at bedtime--- and as needed other times. Use it with a spacer (from the pharmacist--or an empty toilet paper roll)

## 2016-01-19 NOTE — Progress Notes (Signed)
   Subjective:    Patient ID: Toni Henry, female    DOB: [birth year], 82 y.o.   MRN: [id number]  HPI Here due to ongoing respiratory illness  Now getting scared--coughing and can't bring phlegm up (and gets SOB feeling) Then feels better if she does get it up Not sleeping well due to this If not coughing, breathing is okay No fever Sputum is brownish and thick  Took some OTC cough med--but not regular  Current Outpatient Prescriptions on File Prior to Visit  Medication Sig Dispense Refill  . acetaminophen (TYLENOL) 650 MG CR tablet Take 650 mg by mouth every 8 (eight) hours as needed.     . Cholecalciferol (VITAMIN D3) 2000 UNITS TABS Take 1 tablet by mouth daily.    . Lutein 10 MG TABS Take 1 tablet by mouth daily.    . Multiple Vitamin (MULTIVITAMIN) capsule Take 1 capsule by mouth daily.     No current facility-administered medications on file prior to visit.    No Known Allergies  Past Medical History  Diagnosis Date  . GERD (gastroesophageal reflux disease)   . Allergic rhinitis due to pollen   . Macular degeneration   . Osteoporosis     Past Surgical History  Procedure Laterality Date  . Total hip arthroplasty Right ~2010  . Hip fracture surgery Left 1/14  . Abdominal hysterectomy      No family history on file.  Social History   Social History  . Marital Status: Widowed    Spouse Name: N/A  . Number of Children: 2  . Years of Education: N/A   Occupational History  . Schoolteacher     Retired   Social History Main Topics  . Smoking status: Never Smoker   . Smokeless tobacco: Never Used  . Alcohol Use: Yes  . Drug Use: No  . Sexual Activity: Not on file   Other Topics Concern  . Not on file   Social History Narrative   Widowed ~2012   2 daughters      Has living will   Designated SIL Richard as health care POA   Requests DNR--form done 11/12/12   Would not accept feeding tube    Review of Systems Did have episode of "losing breath" when  swimming some years ago-- took symbicort briefly No problems with heartburn No allergy problems    Objective:   Physical Exam  Constitutional: She appears well-developed. No distress.  HENT:  Mouth/Throat: Oropharynx is clear and moist. No oropharyngeal exudate.  Mild pale nasal congestion   Neck: Normal range of motion. Neck supple. No thyromegaly present.  Pulmonary/Chest: Effort normal. No respiratory distress. She has no rales.  Slightly decreased breath sounds and slight prolonged expiratory phase--tight  Lymphadenopathy:    She has no cervical adenopathy.          Assessment & Plan:

## 2016-01-19 NOTE — Progress Notes (Signed)
Pre visit review using our clinic review tool, if applicable. No additional management support is needed unless otherwise documented below in the visit note. 

## 2016-01-19 NOTE — Assessment & Plan Note (Signed)
Will give 6 days of prednisone Antibiotic Albuterol inhaler

## 2016-02-07 ENCOUNTER — Ambulatory Visit (INDEPENDENT_AMBULATORY_CARE_PROVIDER_SITE_OTHER): Payer: Medicare Other | Admitting: Internal Medicine

## 2016-02-07 ENCOUNTER — Encounter: Payer: Self-pay | Admitting: Internal Medicine

## 2016-02-07 VITALS — BP 128/72 | HR 67 | Temp 97.7°F | Ht <= 58 in | Wt 97.0 lb

## 2016-02-07 DIAGNOSIS — Z0000 Encounter for general adult medical examination without abnormal findings: Secondary | ICD-10-CM | POA: Insufficient documentation

## 2016-02-07 DIAGNOSIS — E441 Mild protein-calorie malnutrition: Secondary | ICD-10-CM | POA: Diagnosis not present

## 2016-02-07 DIAGNOSIS — K219 Gastro-esophageal reflux disease without esophagitis: Secondary | ICD-10-CM | POA: Diagnosis not present

## 2016-02-07 DIAGNOSIS — Z23 Encounter for immunization: Secondary | ICD-10-CM | POA: Diagnosis not present

## 2016-02-07 DIAGNOSIS — J439 Emphysema, unspecified: Secondary | ICD-10-CM

## 2016-02-07 DIAGNOSIS — Z7189 Other specified counseling: Secondary | ICD-10-CM

## 2016-02-07 DIAGNOSIS — G479 Sleep disorder, unspecified: Secondary | ICD-10-CM

## 2016-02-07 LAB — CBC WITH DIFFERENTIAL/PLATELET
BASOS ABS: 0 10*3/uL (ref 0.0–0.1)
Basophils Relative: 0.3 % (ref 0.0–3.0)
Eosinophils Absolute: 0.1 10*3/uL (ref 0.0–0.7)
Eosinophils Relative: 2.4 % (ref 0.0–5.0)
HEMATOCRIT: 35.7 % — AB (ref 36.0–46.0)
HEMOGLOBIN: 12.1 g/dL (ref 12.0–15.0)
LYMPHS PCT: 10.7 % — AB (ref 12.0–46.0)
Lymphs Abs: 0.5 10*3/uL — ABNORMAL LOW (ref 0.7–4.0)
MCHC: 33.9 g/dL (ref 30.0–36.0)
MCV: 92.1 fl (ref 78.0–100.0)
MONOS PCT: 11 % (ref 3.0–12.0)
Monocytes Absolute: 0.5 10*3/uL (ref 0.1–1.0)
Neutro Abs: 3.7 10*3/uL (ref 1.4–7.7)
Neutrophils Relative %: 75.6 % (ref 43.0–77.0)
Platelets: 247 10*3/uL (ref 150.0–400.0)
RBC: 3.87 Mil/uL (ref 3.87–5.11)
RDW: 13.9 % (ref 11.5–15.5)
WBC: 4.9 10*3/uL (ref 4.0–10.5)

## 2016-02-07 LAB — COMPREHENSIVE METABOLIC PANEL
ALK PHOS: 85 U/L (ref 39–117)
ALT: 17 U/L (ref 0–35)
AST: 21 U/L (ref 0–37)
Albumin: 3.9 g/dL (ref 3.5–5.2)
BILIRUBIN TOTAL: 0.6 mg/dL (ref 0.2–1.2)
BUN: 19 mg/dL (ref 6–23)
CALCIUM: 9.1 mg/dL (ref 8.4–10.5)
CO2: 26 meq/L (ref 19–32)
Chloride: 97 mEq/L (ref 96–112)
Creatinine, Ser: 0.93 mg/dL (ref 0.40–1.20)
GFR: 59.71 mL/min — AB (ref >60.00)
Glucose, Bld: 109 mg/dL — ABNORMAL HIGH (ref 70–99)
Potassium: 4.6 mEq/L (ref 3.5–5.1)
Sodium: 130 mEq/L — ABNORMAL LOW (ref 135–145)
TOTAL PROTEIN: 6.8 g/dL (ref 6.0–8.3)

## 2016-02-07 LAB — T4, FREE: Free T4: 1.08 ng/dL (ref 0.60–1.60)

## 2016-02-07 NOTE — Assessment & Plan Note (Signed)
Has DNR 

## 2016-02-07 NOTE — Assessment & Plan Note (Signed)
No major daytime problems Discussed trying tylenol

## 2016-02-07 NOTE — Progress Notes (Signed)
Subjective:    Patient ID: Toni Henry, female    DOB: November 30, 1919, 82 y.o.   MRN: [id number]  HPI Here with daughter for Medicare wellness and follow up of chronic medical conditions Reviewed form and advanced directives Reviewed other doctors Vision not great and some changes Poor hearing--has aides Does try to exercise No falls in past year Independent with instrumental ADLs--but doesn't drive Mild memory changes No depression or anhedonia  Cough is better Breathing is still somewhat short Will get sense of SOB at various times Wheezy at times--- but not all the time No DOE for normal daytime activities (except can't push the sweeper, etc) Weight down a little despite using nutritional supplement No asthma but did have allergies in past 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 Usually helps  Current Outpatient Prescriptions on File Prior to Visit  Medication Sig Dispense Refill  . acetaminophen (TYLENOL) 650 MG CR tablet Take 650 mg by mouth every 8 (eight) hours as needed. Reported on 02/07/2016    . Cholecalciferol (VITAMIN D3) 2000 UNITS TABS Take 1 tablet by mouth daily.    . Lutein 10 MG TABS Take 1 tablet by mouth daily.    . Multiple Vitamin (MULTIVITAMIN) capsule Take 1 capsule by mouth daily.    Marland Kitchen albuterol (PROVENTIL HFA;VENTOLIN HFA) 108 (90 Base) MCG/ACT inhaler Inhale 2 puffs into the lungs every 6 (six) hours as needed for wheezing or shortness of breath. (Patient not taking: Reported on 02/07/2016) 1 Inhaler 1   No current facility-administered medications on file prior to visit.    No Known  Allergies  Past Medical History  Diagnosis Date  . GERD (gastroesophageal reflux disease)   . Allergic rhinitis due to pollen   . Macular degeneration   . Osteoporosis     Past Surgical History  Procedure Laterality Date  . Total hip arthroplasty Right ~2010  . Hip fracture surgery Left 1/14  . Abdominal hysterectomy      No family history on file.  Social History   Social History  . Marital Status: Widowed    Spouse Name: N/A  . Number of Children: 2  . Years of Education: N/A   Occupational History  . Schoolteacher     Retired   Social History Main Topics  . Smoking status: Never Smoker   . Smokeless tobacco: Never Used  . Alcohol Use: Yes  . Drug Use: No  . Sexual Activity: Not on file   Other Topics Concern  . Not on file   Social History Narrative   Widowed ~2012   2 daughters      Has living will   Designated SIL Izella Ybanez as health care POA   Requests DNR--form done 11/12/12   Would not accept feeding tube   Review of Systems Not a great eater--weight is down a little Sleep remains difficult-- but generally not tired in the day Doesn't drive anymore--does wear seat belt No chest pain or palpitations No dizziness or syncope Bowels are okay--no blood No urinary problems--no dysuria or hematuria No other arthritis issues Dry skin but no lesions    Objective:   Physical Exam  Constitutional: She is oriented to person, place, and time. She appears well-developed. No distress.  HENT:  Mouth/Throat: Oropharynx is clear and moist.  Neck: Normal range of motion. No thyromegaly present.  Cardiovascular: Normal rate, regular rhythm, normal heart sounds and intact distal pulses.  Exam reveals no gallop.   No murmur heard. Pulmonary/Chest: Effort normal and breath sounds normal. No respiratory distress. She has no wheezes. She has no rales.  Abdominal: Soft. There is no tenderness.  Musculoskeletal: She exhibits no edema or tenderness.  Fairly normal ROM  of hips No back tenderness  Lymphadenopathy:    She has no cervical adenopathy.  Neurological: She is alert and oriented to person, place, and time.  President-- "Frances Furbish, from [place]" [id number] Recall 1/3  Skin: No rash noted. No erythema.  Psychiatric: She has a normal mood and affect. Her behavior is normal.          Assessment & Plan:

## 2016-02-07 NOTE — Assessment & Plan Note (Signed)
By radiographs and symptoms Not enough to consider other treatment She will try the albuterol as needed

## 2016-02-07 NOTE — Assessment & Plan Note (Signed)
I have personally reviewed the Medicare Annual Wellness questionnaire and have noted 1. The patient's medical and social history 2. Their use of alcohol, tobacco or illicit drugs 3. Their current medications and supplements 4. The patient's functional ability including ADL's, fall risks, home safety risks and hearing or visual             impairment. 5. Diet and physical activities 6. Evidence for depression or mood disorders  The patients weight, height, BMI and visual acuity have been recorded in the chart I have made referrals, counseling and provided education to the patient based review of the above and I have provided the pt with a written personalized care plan for preventive services.  I have provided you with a copy of your personalized plan for preventive services. Please take the time to review along with your updated medication list.  No cancer screening due to age prevnar today Yearly flu shot Continue with exercise program

## 2016-02-07 NOTE — Assessment & Plan Note (Signed)
Discussed adequate protein intake and supplements

## 2016-02-07 NOTE — Progress Notes (Signed)
Pre visit review using our clinic review tool, if applicable. No additional management support is needed unless otherwise documented below in the visit note. 

## 2016-02-07 NOTE — Addendum Note (Signed)
Addended by: Pilar Grammes on: 02/07/2016 10:03 AM   Modules accepted: Orders

## 2016-02-07 NOTE — Patient Instructions (Signed)
Please try acetaminophen 650mg  at bedtime to see if it helps sleep. Please increase to 2 nutritional supplements daily.

## 2016-02-07 NOTE — Assessment & Plan Note (Signed)
No sig symptoms but rare dysphagia She will let me know if this worsens

## 2016-02-14 ENCOUNTER — Telehealth: Payer: Self-pay | Admitting: Internal Medicine

## 2016-02-14 NOTE — Telephone Encounter (Signed)
[facility]  Patient Name: Toni Henry  DOB: 1919-11-19    Initial Comment has pain in her left breast, feels like the pain is coming from the inside, hard to sleep and get comfortable.    Nurse Assessment  Nurse: Wynetta Emery, RN, Baker Janus Date/Time Eilene Ghazi Time): 02/14/2016 10:48:31 AM  Confirm and document reason for call. If symptomatic, describe symptoms. You must click the next button to save text entered. ---[name] is having left breast pain and and mostly at night and the pain keeps her awake at night.  Has the patient traveled out of the country within the last 30 days? ---No  Does the patient have any new or worsening symptoms? ---Yes  Will a triage be completed? ---Yes  Related visit to physician within the last 2 weeks? ---N/A  Does the PT have any chronic conditions? (i.e. diabetes, asthma, etc.) ---Unknown  Is this a behavioral health or substance abuse call? ---No     Guidelines    Guideline Title Affirmed Question Affirmed Notes  Breast Symptoms [1] Breast pain AND [2] cause is not known    Final Disposition User   See Physician within 24 Hours Wynetta Emery, RN, Baker Janus    Comments  NOTE no available appt with Dr. Silvio Pate today or tomorrow --given appt with Arnette Norris MD at 1200pm 02/15/2016   Referrals  REFERRED TO PCP OFFICE   Disagree/Comply: Leta Baptist

## 2016-02-14 NOTE — Telephone Encounter (Signed)
TH scheduled appt on 02/15/16 at 12 noon with Dr Deborra Medina.

## 2016-02-15 ENCOUNTER — Ambulatory Visit: Payer: Self-pay | Admitting: Family Medicine

## 2016-06-01 ENCOUNTER — Other Ambulatory Visit: Payer: Self-pay | Admitting: Internal Medicine

## 2016-06-21 DIAGNOSIS — Z23 Encounter for immunization: Secondary | ICD-10-CM | POA: Diagnosis not present

## 2016-08-13 ENCOUNTER — Encounter: Payer: Self-pay | Admitting: Internal Medicine

## 2016-08-13 ENCOUNTER — Ambulatory Visit (INDEPENDENT_AMBULATORY_CARE_PROVIDER_SITE_OTHER)
Admission: RE | Admit: 2016-08-13 | Discharge: 2016-08-13 | Disposition: A | Discharge status: Home / Self Care | Payer: Medicare Other | Source: Ambulatory Visit | Attending: Internal Medicine | Admitting: Internal Medicine

## 2016-08-13 ENCOUNTER — Ambulatory Visit (INDEPENDENT_AMBULATORY_CARE_PROVIDER_SITE_OTHER): Payer: Medicare Other | Admitting: Internal Medicine

## 2016-08-13 VITALS — BP 128/80 | HR 82 | Temp 98.0°F | Wt 92.0 lb

## 2016-08-13 DIAGNOSIS — J441 Chronic obstructive pulmonary disease with (acute) exacerbation: Secondary | ICD-10-CM

## 2016-08-13 DIAGNOSIS — R0602 Shortness of breath: Secondary | ICD-10-CM | POA: Diagnosis not present

## 2016-08-13 DIAGNOSIS — R05 Cough: Secondary | ICD-10-CM | POA: Diagnosis not present

## 2016-08-13 MED ORDER — TIOTROPIUM BROMIDE MONOHYDRATE 18 MCG IN CAPS: 18.0000 ug | ORAL_CAPSULE | Freq: Every day | RESPIRATORY_TRACT | 12 refills | Status: DC

## 2016-08-13 MED ORDER — PREDNISONE 20 MG PO TABS: 40.0000 mg | ORAL_TABLET | Freq: Every day | ORAL | 0 refills | Status: DC

## 2016-08-13 NOTE — Progress Notes (Signed)
   Subjective:    Patient ID: Toni Henry, female    DOB: 1919-11-09, 82 y.o.   MRN: [id number]  HPI Here with daughter-- due to cough  Having a frequent, hard cough Similar to spell from May Albuterol inhaler still helps but isn't lasting (not over 4 hours)  This has been going on for 2-3 months now Less relief from the albuterol in the past month Exercise seems to help  No fever Cough productive of thick beige phlegm--somewhat worse No chills or sweats  Current Outpatient Prescriptions on File Prior to Visit  Medication Sig Dispense Refill  . Cholecalciferol (VITAMIN D3) 2000 UNITS TABS Take 1 tablet by mouth daily.    . Lutein 10 MG TABS Take 1 tablet by mouth daily.    . Multiple Vitamin (MULTIVITAMIN) capsule Take 1 capsule by mouth daily.    Marland Kitchen PROAIR HFA 108 (90 Base) MCG/ACT inhaler INHALE 2 PUFFS INTO THE LUNGS EVERY 6 HOURS AS NEEDED FOR WHEEZING OR SHORTNESS OF BREATH 8.5 Inhaler 3   No current facility-administered medications on file prior to visit.     No Known Allergies  Past Medical History:  Diagnosis Date  . Allergic rhinitis due to pollen   . GERD (gastroesophageal reflux disease)   . Macular degeneration   . Osteoporosis     Past Surgical History:  Procedure Laterality Date  . ABDOMINAL HYSTERECTOMY    . HIP FRACTURE SURGERY Left 1/14  . TOTAL HIP ARTHROPLASTY Right ~2010    No family history on file.  Social History   Social History  . Marital status: Widowed    Spouse name: N/A  . Number of children: 2  . Years of education: N/A   Occupational History  . Schoolteacher     Retired   Social History Main Topics  . Smoking status: Never Smoker  . Smokeless tobacco: Never Used  . Alcohol use Yes  . Drug use: No  . Sexual activity: Not on file   Other Topics Concern  . Not on file   Social History Narrative   Widowed ~2012   2 daughters      Has living will   Designated SIL Merica Prell as health care POA   Requests DNR--form done  11/12/12   Would not accept feeding tube   Review of Systems  Appetite is good She was surprised she lost weight Usually sleeps okay---occasionally only 5-6 hours due to the respiratory issues    Objective:   Physical Exam  Constitutional: No distress.  Cardiovascular: Normal rate, regular rhythm and normal heart sounds.  Exam reveals no gallop.   No murmur heard. Pulmonary/Chest: Effort normal.  Decreased breath sounds but no  Crackles Slight audible wheeze Does not seem tight Fairly normal expiratory phase          Assessment & Plan:

## 2016-08-13 NOTE — Progress Notes (Signed)
Pre visit review using our clinic review tool, if applicable. No additional management support is needed unless otherwise documented below in the visit note. 

## 2016-08-13 NOTE — Patient Instructions (Signed)
Metered Dose Inhaler With Spacer Inhaled medicines are the basis of treatment of asthma and other breathing problems. Inhaled medicine can only be effective if used properly. Good technique assures that the medicine reaches the lungs. Your health care provider has asked you to use a spacer with your inhaler to help you take the medicine more effectively. A spacer is a plastic tube with a mouthpiece on one end and an opening that connects to the inhaler on the other end. Metered dose inhalers (MDIs) are used to deliver a variety of inhaled medicines. These include quick relief or rescue medicines (such as bronchodilators) and controller medicines (such as corticosteroids). The medicine is delivered by pushing down on a metal canister to release a set amount of spray. If you are using different kinds of inhalers, use your quick relief medicine to open the airways 10-15 minutes before using a steroid if instructed to do so by your health care provider. If you are unsure which inhalers to use and the order of using them, ask your health care provider, nurse, or respiratory therapist. HOW TO USE THE INHALER WITH A SPACER 1. Remove cap from inhaler. 2. If you are using the inhaler for the first time, you will need to prime it. Shake the inhaler for 5 seconds and release four puffs into the air, away from your face. Ask your health care provider or pharmacist if you have questions about priming your inhaler. 3. Shake inhaler for 5 seconds before each breath in (inhalation). 4. Place the open end of the spacer onto the mouthpiece of the inhaler. 5. Position the inhaler so that the top of the canister faces up and the spacer mouthpiece faces you. 6. Put your index finger on the top of the medicine canister. Your thumb supports the bottom of the inhaler and the spacer. 7. Breathe out (exhale) normally and as completely as possible. 8. Immediately after exhaling, place the spacer between your teeth and into your  mouth. Close your mouth tightly around the spacer. 9. Press the canister down with the index finger to release the medicine. 10. At the same time as the canister is pressed, inhale deeply and slowly until the lungs are completely filled. This should take 4-6 seconds. Keep your tongue down and out of the way. 11. Hold the medicine in your lungs for 5-10 seconds (10 seconds is best). This helps the medicine get into the small airways of your lungs. Exhale. 12. Repeat inhaling deeply through the spacer mouthpiece. Again hold that breath for up to 10 seconds (10 seconds is best). Exhale slowly. If it is difficult to take this second deep breath through the spacer, breathe normally several times through the spacer. Remove the spacer from your mouth. 13. Wait at least 15-30 seconds between puffs. Continue with the above steps until you have taken the number of puffs your health care provider has ordered. Do not use the inhaler more than your health care provider directs you to. 14. Remove spacer from the inhaler and place cap on inhaler. 15. Follow the directions from your health care provider or the inhaler insert for cleaning the inhaler and spacer. If you are using a steroid inhaler, rinse your mouth with water after your last puff, gargle, and spit out the water. Do not swallow the water. AVOID:   Inhaling before or after starting the spray of medicine. It takes practice to coordinate your breathing with triggering the spray.  Inhaling through the nose (rather than the mouth) when  triggering the spray. HOW TO DETERMINE IF YOUR INHALER IS FULL OR NEARLY EMPTY You cannot know when an inhaler is empty by shaking it. A few inhalers are now being made with dose counters. Ask your health care provider for a prescription that has a dose counter if you feel you need that extra help. If your inhaler does not have a counter, ask your health care provider to help you determine the date you need to refill your  inhaler. Write the refill date on a calendar or your inhaler canister. Refill your inhaler 7-10 days before it runs out. Be sure to keep an adequate supply of medicine. This includes making sure it is not expired, and you have a spare inhaler.  SEEK MEDICAL CARE IF:   Symptoms are only partially relieved with your inhaler.  You are having trouble using your inhaler.  You experience some increase in phlegm. SEEK IMMEDIATE MEDICAL CARE IF:   You feel little or no relief with your inhalers. You are still wheezing and are feeling shortness of breath or tightness in your chest or both.  You have dizziness, headaches, or fast heart rate.  You have chills, fever, or night sweats.  There is a noticeable increase in phlegm production, or there is blood in the phlegm. This information is not intended to replace advice given to you by your health care provider. Make sure you discuss any questions you have with your health care provider. Document Released: 08/13/2005 Document Revised: 12/28/2014 Document Reviewed: 01/29/2013 Elsevier Interactive Patient Education  2017 [employer]. How to Use a Dry Powder Inhaler A dry powder inhaler (DPI) is a device for taking medicine that must be breathed into the lungs (inhaled). The device is also called a disk inhaler. Your doctor will tell you how often to use your inhaler. What are the risks? If you do not use your inhaler correctly, medicine might not get to your lungs to help you breathe. Inhaler medicine can cause side effects, such as:  Mouth infection.  Throat infection.  Cough.  A voice that sounds rough (hoarseness).  Headache.  A feeling that you are going to throw up (nausea).  Throwing up (vomiting).  Lung infection (pneumonia). This can happen if you have chronic obstructive lung (pulmonary) disease (COPD). Supplies needed:  An inhaler. The medicine that you will need is already in the inhaler. How to use a dry powder  inhaler 1. Remove any gum or candy from your mouth. 2. Stand or sit up straight. 3. Hold the inhaler in one hand. 4. Put the thumb of your other hand in the thumb grip of the inhaler. Next, push your thumb away from you as far as it will go. 5. Hold the inhaler flat, like a hamburger, with the mouthpiece facing you. 6. Use your other hand to slide the lever that is near the mouthpiece away from you until you hear a click. 7. Turn your head and breathe out. Do not breathe out into the mouthpiece. 8. Turn your head back to the mouthpiece and seal your lips around it. 9. Take a deep breath through your mouth. Do not breathe through your nose. 10. Hold your breath for 10 seconds. 11. Remove the inhaler from your mouth, turn your head, and breathe out slowly. 12. Close the inhaler. To do this, slide the thumb grip back to the closed position. 13. Check the number on the inhaler that shows how much medicine (how many doses) the inhaler has in it. The  number should go down by one each time you use the inhaler. 14. Rinse your mouth with water. General recommendations  Do not drop or shake your inhaler.  Do not wash your inhaler. If the mouthpiece gets dirty, use a dry cloth to wipe it clean.  Do not breathe into the inhaler.  Store your inhaler in a dry place at room temperature. Do not store it in your bathroom.  Keep track of your doses:  When the dose number turns red, you have five doses left. That means it is time to pick up a new inhaler at your pharmacy.  When the dose number shows zero (0), throw away the inhaler. Follow these instructions at home:  Take your inhaled medicine only as told by your doctor.  Keep all follow-up visits as told by your doctor. This is important. Contact a health care provider if:  You have a sore mouth.  You have a sore throat.  You have a lasting (persistent) cough.  Your voice changes.  You have a fever.  You have any of these problems  after starting to use your inhaler:  You get headaches often.  You often have a feeling that you are going to throw up.  You throw up often.  Your asthma or COPD symptoms have not improved after you have been using your inhaler for a few weeks. Get help right away if:  You have very bad shortness of breath.  You have trouble breathing. Summary  A dry powder inhaler (DPI) is a device that has medicine in it that should help you breathe better.  Follow instructions from your doctor about using your inhaler.  Your doctor will prescribe the strength of the medicine and how often you need to inhale it.  Keep your inhaler dry. The medicine in the inhaler must be kept dry.  If you have any questions about how to use your inhaler, talk with your doctor or pharmacist. This information is not intended to replace advice given to you by your health care provider. Make sure you discuss any questions you have with your health care provider. Document Released: 05/22/2008 Document Revised: 05/07/2016 Document Reviewed: 05/07/2016 Elsevier Interactive Patient Education  2017 [employer].

## 2016-08-13 NOTE — Assessment & Plan Note (Addendum)
Low grade but ongoing  CXR shows sig hyperinflation but no pneumonia Doesn't really seem to have infection--due to chronicity Will add tiotropium Consider long acting beta agonist Needs spacer for the albuterol Will recheck 1 month and consider changes

## 2016-09-17 ENCOUNTER — Ambulatory Visit: Payer: Medicare Other | Admitting: Internal Medicine

## 2016-09-19 ENCOUNTER — Ambulatory Visit: Payer: Medicare Other | Admitting: Internal Medicine

## 2016-09-20 ENCOUNTER — Telehealth: Payer: Self-pay

## 2016-09-20 NOTE — Telephone Encounter (Signed)
Pt wants to change pharmacy to [facility]. Pt does not need any med at this time. Pharmacy change done as requested.

## 2016-11-15 DIAGNOSIS — L820 Inflamed seborrheic keratosis: Secondary | ICD-10-CM | POA: Diagnosis not present

## 2016-11-15 DIAGNOSIS — C44329 Squamous cell carcinoma of skin of other parts of face: Secondary | ICD-10-CM | POA: Diagnosis not present

## 2016-11-15 DIAGNOSIS — L578 Other skin changes due to chronic exposure to nonionizing radiation: Secondary | ICD-10-CM | POA: Diagnosis not present

## 2016-11-15 DIAGNOSIS — Z85828 Personal history of other malignant neoplasm of skin: Secondary | ICD-10-CM | POA: Diagnosis not present

## 2017-02-06 DIAGNOSIS — L578 Other skin changes due to chronic exposure to nonionizing radiation: Secondary | ICD-10-CM | POA: Diagnosis not present

## 2017-02-06 DIAGNOSIS — Z85828 Personal history of other malignant neoplasm of skin: Secondary | ICD-10-CM | POA: Diagnosis not present

## 2017-02-06 DIAGNOSIS — D485 Neoplasm of uncertain behavior of skin: Secondary | ICD-10-CM | POA: Diagnosis not present

## 2017-02-06 DIAGNOSIS — L820 Inflamed seborrheic keratosis: Secondary | ICD-10-CM | POA: Diagnosis not present

## 2017-02-06 DIAGNOSIS — C44729 Squamous cell carcinoma of skin of left lower limb, including hip: Secondary | ICD-10-CM | POA: Diagnosis not present

## 2017-02-12 ENCOUNTER — Ambulatory Visit (INDEPENDENT_AMBULATORY_CARE_PROVIDER_SITE_OTHER): Payer: Medicare Other | Admitting: Internal Medicine

## 2017-02-12 ENCOUNTER — Encounter: Payer: Self-pay | Admitting: Internal Medicine

## 2017-02-12 VITALS — BP 106/62 | HR 92 | Temp 97.5°F | Ht <= 58 in | Wt 92.5 lb

## 2017-02-12 DIAGNOSIS — J439 Emphysema, unspecified: Secondary | ICD-10-CM

## 2017-02-12 DIAGNOSIS — S339XXA Sprain of unspecified parts of lumbar spine and pelvis, initial encounter: Secondary | ICD-10-CM | POA: Diagnosis not present

## 2017-02-12 DIAGNOSIS — Z0000 Encounter for general adult medical examination without abnormal findings: Secondary | ICD-10-CM | POA: Diagnosis not present

## 2017-02-12 DIAGNOSIS — Z7189 Other specified counseling: Secondary | ICD-10-CM | POA: Diagnosis not present

## 2017-02-12 DIAGNOSIS — S335XXA Sprain of ligaments of lumbar spine, initial encounter: Secondary | ICD-10-CM | POA: Insufficient documentation

## 2017-02-12 DIAGNOSIS — E441 Mild protein-calorie malnutrition: Secondary | ICD-10-CM | POA: Diagnosis not present

## 2017-02-12 LAB — CBC WITH DIFFERENTIAL/PLATELET
BASOS PCT: 0.5 % (ref 0.0–3.0)
Basophils Absolute: 0 10*3/uL (ref 0.0–0.1)
EOS ABS: 0.1 10*3/uL (ref 0.0–0.7)
EOS PCT: 1.4 % (ref 0.0–5.0)
HCT: 37.2 % (ref 36.0–46.0)
Hemoglobin: 12.9 g/dL (ref 12.0–15.0)
LYMPHS PCT: 10.6 % — AB (ref 12.0–46.0)
Lymphs Abs: 0.6 10*3/uL — ABNORMAL LOW (ref 0.7–4.0)
MCHC: 34.7 g/dL (ref 30.0–36.0)
MCV: 91.1 fl (ref 78.0–100.0)
Monocytes Absolute: 0.8 10*3/uL (ref 0.1–1.0)
Monocytes Relative: 12.6 % — ABNORMAL HIGH (ref 3.0–12.0)
NEUTROS ABS: 4.5 10*3/uL (ref 1.4–7.7)
Neutrophils Relative %: 74.9 % (ref 43.0–77.0)
PLATELETS: 234 10*3/uL (ref 150.0–400.0)
RBC: 4.09 Mil/uL (ref 3.87–5.11)
RDW: 13.2 % (ref 11.5–15.5)
WBC: 6 10*3/uL (ref 4.0–10.5)

## 2017-02-12 LAB — COMPREHENSIVE METABOLIC PANEL
ALT: 13 U/L (ref 0–35)
AST: 20 U/L (ref 0–37)
Albumin: 4.2 g/dL (ref 3.5–5.2)
Alkaline Phosphatase: 94 U/L (ref 39–117)
BUN: 23 mg/dL (ref 6–23)
CALCIUM: 9.7 mg/dL (ref 8.4–10.5)
CHLORIDE: 95 meq/L — AB (ref 96–112)
CO2: 29 meq/L (ref 19–32)
CREATININE: 0.94 mg/dL (ref 0.40–1.20)
GFR: 58.85 mL/min — ABNORMAL LOW (ref >60.00)
Glucose, Bld: 100 mg/dL — ABNORMAL HIGH (ref 70–99)
POTASSIUM: 4.5 meq/L (ref 3.5–5.1)
SODIUM: 130 meq/L — AB (ref 135–145)
Total Bilirubin: 0.5 mg/dL (ref 0.2–1.2)
Total Protein: 6.9 g/dL (ref 6.0–8.3)

## 2017-02-12 LAB — T4, FREE: Free T4: 1.04 ng/dL (ref 0.60–1.60)

## 2017-02-12 NOTE — Patient Instructions (Signed)
I recommend miralax regularly for your bowels--- 1/2-1 capful daily with a large glass of water.

## 2017-02-12 NOTE — Assessment & Plan Note (Signed)
Cough gone on the spiriva Has albuterol for prn

## 2017-02-12 NOTE — Assessment & Plan Note (Signed)
I have personally reviewed the Medicare Annual Wellness questionnaire and have noted 1. The patient's medical and social history 2. Their use of alcohol, tobacco or illicit drugs 3. Their current medications and supplements 4. The patient's functional ability including ADL's, fall risks, home safety risks and hearing or visual             impairment. 5. Diet and physical activities 6. Evidence for depression or mood disorders  The patients weight, height, BMI and visual acuity have been recorded in the chart I have made referrals, counseling and provided education to the patient based review of the above and I have provided the pt with a written personalized care plan for preventive services.  I have provided you with a copy of your personalized plan for preventive services. Please take the time to review along with your updated medication list.  No cancer screening due to age No vaccines other than flu vaccine Discussed increasing housekeeping and prepared meals, etc

## 2017-02-12 NOTE — Progress Notes (Signed)
Subjective:    Patient ID: Toni Henry, female    DOB: 1920/02/17, 83 y.o.   MRN: [id number]  HPI Here for Medicare wellness and follow up of chronic health conditions With daughter Reviewed form and advanced directives Reviewed other doctors--Porfilio for eyes No tobacco or alcohol Tries to do some chair exercises Doesn't drive and bills on aut[employer] Friends or Lucianne Lei will bring her to do grocery shopping  Housekeeper once a quarter--deep cleaning. Daughter helps some Vision not good --has eye appt coming up Hearing is poor--uses aides Cognition is slow--takes a while to come up with things. Clear short term loss but nothing that has affected her  No falls  Occasional down times--- "I'm no good to anybody", but not persistent. Not anhedonic though  Injured back trying to move wing backed chair Occurred over 3 weeks Gets "grabbing" when she reaches No problems in bed and does okay when up and loosens up Right side Has tried heating pad--some help Ibuprofen intermittently (200mg  up to 4 times a day)---this also helps No radiation to legs--or leg weakness  Weight is about the same Uses ensure at times Some meals from the [employer]  Using spiriva No regular cough now Breathing is okay--no SOB with her normal activities Has albuterol but not needing  Current Outpatient Prescriptions on File Prior to Visit  Medication Sig Dispense Refill  . Cholecalciferol (VITAMIN D3) 2000 UNITS TABS Take 1 tablet by mouth daily.    . Lutein 10 MG TABS Take 1 tablet by mouth daily.    . Multiple Vitamin (MULTIVITAMIN) capsule Take 1 capsule by mouth daily.    Marland Kitchen tiotropium (SPIRIVA) 18 MCG inhalation capsule Place 1 capsule (18 mcg total) into inhaler and inhale daily. 30 capsule 12   No current facility-administered medications on file prior to visit.     No Known Allergies  Past Medical History:  Diagnosis Date  . Allergic rhinitis due to pollen   . GERD (gastroesophageal reflux  disease)   . Macular degeneration   . Osteoporosis     Past Surgical History:  Procedure Laterality Date  . ABDOMINAL HYSTERECTOMY    . HIP FRACTURE SURGERY Left 1/14  . TOTAL HIP ARTHROPLASTY Right ~2010    No family history on file.  Social History   Social History  . Marital status: Widowed    Spouse name: N/A  . Number of children: 2  . Years of education: N/A   Occupational History  . Schoolteacher     Retired   Social History Main Topics  . Smoking status: Never Smoker  . Smokeless tobacco: Never Used  . Alcohol use Yes  . Drug use: No  . Sexual activity: Not on file   Other Topics Concern  . Not on file   Social History Narrative   Widowed ~2012   2 daughters      Has living will   Designated SIL Izrael Peak as health care POA   Requests DNR--form done 11/12/12   Would not accept feeding tube   Review of Systems Weight stable since 6 months ago Sleeps okay Wears seat belt Teeth okay--- keeps up with dentist (Dr Bronson Curb) Chronic dry mouth--chews gum regularly No heartburn or dysphagia Constipation now--- uses something (doesn't remember what) No trouble voiding. No incontinence Lesions removed from right cheek and left leg at derm last week Nehemiah Massed) No edema No chest pain or palpitations No dizziness or syncope    Objective:   Physical Exam  Constitutional: She is oriented  to person, place, and time. No distress.  HENT:  Mouth/Throat: Oropharynx is clear and moist. No oropharyngeal exudate.  Neck: No thyromegaly present.  Cardiovascular: Normal rate, regular rhythm, normal heart sounds and intact distal pulses.  Exam reveals no gallop.   No murmur heard. Pulmonary/Chest: Effort normal. No respiratory distress. She has no wheezes. She has no rales.  Decreased breath sounds but clear  Abdominal: Soft. There is no tenderness.  Musculoskeletal: She exhibits no edema.  No spine tenderness Mild right lateral low back pain No SLR pain    Lymphadenopathy:    She has no cervical adenopathy.  Neurological: She is alert and oriented to person, place, and time.  President --"Daisy Floro, Obama, Bush" [phone] [id number] Recall 3/3  Skin:  Healing lesions from excisions  Psychiatric: She has a normal mood and affect. Her behavior is normal.          Assessment & Plan:

## 2017-02-12 NOTE — Assessment & Plan Note (Signed)
Discussed increasing supplements, prepared meals from [facility] Consider assisted living

## 2017-02-12 NOTE — Assessment & Plan Note (Signed)
Seems mostly muscular Discussed heat, prn ibuprofen Must get help for moving stuff, etc

## 2017-02-12 NOTE — Assessment & Plan Note (Signed)
Has DNR 

## 2017-02-21 ENCOUNTER — Ambulatory Visit (INDEPENDENT_AMBULATORY_CARE_PROVIDER_SITE_OTHER)
Admission: RE | Admit: 2017-02-21 | Discharge: 2017-02-21 | Disposition: A | Discharge status: Home / Self Care | Payer: Medicare Other | Source: Ambulatory Visit | Attending: Family Medicine | Admitting: Family Medicine

## 2017-02-21 ENCOUNTER — Ambulatory Visit (INDEPENDENT_AMBULATORY_CARE_PROVIDER_SITE_OTHER): Payer: Medicare Other | Admitting: Family Medicine

## 2017-02-21 ENCOUNTER — Encounter: Payer: Self-pay | Admitting: Family Medicine

## 2017-02-21 VITALS — BP 158/76 | HR 84 | Temp 98.1°F | Wt 96.5 lb

## 2017-02-21 DIAGNOSIS — R140 Abdominal distension (gaseous): Secondary | ICD-10-CM | POA: Diagnosis not present

## 2017-02-21 DIAGNOSIS — K5900 Constipation, unspecified: Secondary | ICD-10-CM | POA: Insufficient documentation

## 2017-02-21 NOTE — Assessment & Plan Note (Signed)
Normoactive bowel sounds, not consistent with obstructive pattern. She has not been regular with bowel regimen. rec start 1/2-1 capful miralax daily. Discussed ok to try glycerin suppository if miralax ineffective tonight.  Check 2 view abd films today.  Advised she call us tomorrow with an update. Pt agrees with plan.

## 2017-02-21 NOTE — Patient Instructions (Addendum)
Start regularly taking miralax 1/2-1 capful once daily in a 8 or 16oz glass of fluid.  May try 1 glycerin suppository (over the counter) this afternoon if miralax doesn't help.  Call us tomorrow with an update.  Xray today.  Lots of fluids and lots of fiber in the diet.   Constipation, Adult Constipation is when a person has fewer bowel movements in a week than normal, has difficulty having a bowel movement, or has stools that are dry, hard, or larger than normal. Constipation may be caused by an underlying condition. It may become worse with age if a person takes certain medicines and does not take in enough fluids. Follow these instructions at home: Eating and drinking   Eat foods that have a lot of fiber, such as fresh fruits and vegetables, whole grains, and beans.  Limit foods that are high in fat, low in fiber, or overly processed, such as french fries, hamburgers, cookies, candies, and soda.  Drink enough fluid to keep your urine clear or pale yellow. General instructions  Exercise regularly or as told by your health care provider.  Go to the restroom when you have the urge to go. Do not hold it in.  Take over-the-counter and prescription medicines only as told by your health care provider. These include any fiber supplements.  Practice pelvic floor retraining exercises, such as deep breathing while relaxing the lower abdomen and pelvic floor relaxation during bowel movements.  Watch your condition for any changes.  Keep all follow-up visits as told by your health care provider. This is important. Contact a health care provider if:  You have pain that gets worse.  You have a fever.  You do not have a bowel movement after 4 days.  You vomit.  You are not hungry.  You lose weight.  You are bleeding from the anus.  You have thin, pencil-like stools. Get help right away if:  You have a fever and your symptoms suddenly get worse.  You leak stool or have blood in  your stool.  Your abdomen is bloated.  You have severe pain in your abdomen.  You feel dizzy or you faint. This information is not intended to replace advice given to you by your health care provider. Make sure you discuss any questions you have with your health care provider. Document Released: 05/11/2004 Document Revised: 03/02/2016 Document Reviewed: 02/01/2016 Elsevier Interactive Patient Education  2017 [employer].

## 2017-02-21 NOTE — Progress Notes (Signed)
BP (!) 158/76 (BP Location: Left Arm, Patient Position: Sitting, Cuff Size: Normal)   Pulse 84   Temp 98.1 F (36.7 C)   Wt 96 lb 8 oz (43.8 kg)   SpO2 94%   BMI 20.88 kg/m    CC: constipation Subjective:    Patient ID: Toni Henry, female    DOB: 06/28/1920, 83 y.o.   MRN: [id number]  HPI: Toni Henry is a 83 y.o. female presenting on 02/21/2017 for Constipation and Back Pain   Here with neighbor and friend. Saw PCP last week for medicare wellness visit. At that visit, PCP had recommended regular miralax 1/2-1 capful daily for constipation.   Endorses ongoing R lower back pain thought pulled back after picking up heavy chair.   Also endorses several week history of constipation.  Last stool was this morning, very small amount. Passing gas ok. Uncomfortable with abdominal bloating. No fevers/chills, nausea/vomiting, diarrhea.  She kept food diary for 2 days.  She has tried a spoonful of miralax for a few days. Has not yet tried milk of magnesia.  She wants to use suppository which has previously helped.   She feels she is drinking water ok.  Appetite down.  Some confusion, worse this week.   Relevant past medical, surgical, family and social history reviewed and updated as indicated. Interim medical history since our last visit reviewed. Allergies and medications reviewed and updated. Outpatient Medications Prior to Visit  Medication Sig Dispense Refill  . Cholecalciferol (VITAMIN D3) 2000 UNITS TABS Take 1 tablet by mouth daily.    . Lutein 10 MG TABS Take 1 tablet by mouth daily.    . Multiple Vitamin (MULTIVITAMIN) capsule Take 1 capsule by mouth daily.    Marland Kitchen tiotropium (SPIRIVA) 18 MCG inhalation capsule Place 1 capsule (18 mcg total) into inhaler and inhale daily. 30 capsule 12   No facility-administered medications prior to visit.      Per HPI unless specifically indicated in ROS section below Review of Systems     Objective:    BP (!) 158/76 (BP Location: Left  Arm, Patient Position: Sitting, Cuff Size: Normal)   Pulse 84   Temp 98.1 F (36.7 C)   Wt 96 lb 8 oz (43.8 kg)   SpO2 94%   BMI 20.88 kg/m   Wt Readings from Last 3 Encounters:  02/21/17 96 lb 8 oz (43.8 kg)  02/12/17 92 lb 8 oz (42 kg)  08/13/16 92 lb (41.7 kg)    Physical Exam  Constitutional: She appears well-developed and well-nourished. No distress.  HENT:  Head: Normocephalic and atraumatic.  Mouth/Throat: Oropharynx is clear and moist. No oropharyngeal exudate.  Cardiovascular: Normal rate, regular rhythm, normal heart sounds and intact distal pulses.   No murmur heard. Pulmonary/Chest: Effort normal and breath sounds normal. No respiratory distress. She has no wheezes. She has no rales.  Abdominal: Soft. Bowel sounds are normal. She exhibits distension (lower abd). She exhibits no mass. There is no hepatosplenomegaly. There is tenderness (mild lower abd). There is no rebound, no guarding and no CVA tenderness.  Musculoskeletal: She exhibits no edema.  Skin: Skin is warm and dry. No rash noted.  Psychiatric: Her mood appears anxious.  Nursing note and vitals reviewed.  Results for orders placed or performed in visit on 02/12/17  Comprehensive metabolic panel  Result Value Ref Range   Sodium 130 (L) 135 - 145 mEq/L   Potassium 4.5 3.5 - 5.1 mEq/L   Chloride 95 (L) 96 - 112  mEq/L   CO2 29 19 - 32 mEq/L   Glucose, Bld 100 (H) 70 - 99 mg/dL   BUN 23 6 - 23 mg/dL   Creatinine, Ser 0.94 0.40 - 1.20 mg/dL   Total Bilirubin 0.5 0.2 - 1.2 mg/dL   Alkaline Phosphatase 94 39 - 117 U/L   AST 20 0 - 37 U/L   ALT 13 0 - 35 U/L   Total Protein 6.9 6.0 - 8.3 g/dL   Albumin 4.2 3.5 - 5.2 g/dL   Calcium 9.7 8.4 - 10.5 mg/dL   GFR 58.85 (L) >60.00 mL/min  CBC with Differential/Platelet  Result Value Ref Range   WBC 6.0 4.0 - 10.5 K/uL   RBC 4.09 3.87 - 5.11 Mil/uL   Hemoglobin 12.9 12.0 - 15.0 g/dL   HCT 37.2 36.0 - 46.0 %   MCV 91.1 78.0 - 100.0 fl   MCHC 34.7 30.0 - 36.0  g/dL   RDW 13.2 11.5 - 15.5 %   Platelets 234.0 150.0 - 400.0 K/uL   Neutrophils Relative % 74.9 43.0 - 77.0 %   Lymphocytes Relative 10.6 (L) 12.0 - 46.0 %   Monocytes Relative 12.6 (H) 3.0 - 12.0 %   Eosinophils Relative 1.4 0.0 - 5.0 %   Basophils Relative 0.5 0.0 - 3.0 %   Neutro Abs 4.5 1.4 - 7.7 K/uL   Lymphs Abs 0.6 (L) 0.7 - 4.0 K/uL   Monocytes Absolute 0.8 0.1 - 1.0 K/uL   Eosinophils Absolute 0.1 0.0 - 0.7 K/uL   Basophils Absolute 0.0 0.0 - 0.1 K/uL  T4, free  Result Value Ref Range   Free T4 1.04 0.60 - 1.60 ng/dL      Assessment & Plan:   Problem List Items Addressed This Visit    Constipation - Primary    Normoactive bowel sounds, not consistent with obstructive pattern. She has not been regular with bowel regimen. rec start 1/2-1 capful miralax daily. Discussed ok to try glycerin suppository if miralax ineffective tonight.  Check 2 view abd films today.  Advised she call us tomorrow with an update. Pt agrees with plan.       Relevant Orders   DG Abd 2 Views       Follow up plan: No Follow-up on file.  Ria Bush, MD

## 2017-02-25 ENCOUNTER — Ambulatory Visit (INDEPENDENT_AMBULATORY_CARE_PROVIDER_SITE_OTHER): Payer: Medicare Other | Admitting: Family Medicine

## 2017-02-25 ENCOUNTER — Ambulatory Visit (INDEPENDENT_AMBULATORY_CARE_PROVIDER_SITE_OTHER)
Admission: RE | Admit: 2017-02-25 | Discharge: 2017-02-25 | Disposition: A | Discharge status: Home / Self Care | Payer: Medicare Other | Source: Ambulatory Visit | Attending: Family Medicine | Admitting: Family Medicine

## 2017-02-25 ENCOUNTER — Encounter: Payer: Self-pay | Admitting: Family Medicine

## 2017-02-25 VITALS — BP 132/70 | HR 74 | Temp 98.2°F | Wt 90.5 lb

## 2017-02-25 DIAGNOSIS — M25511 Pain in right shoulder: Secondary | ICD-10-CM | POA: Insufficient documentation

## 2017-02-25 DIAGNOSIS — R350 Frequency of micturition: Secondary | ICD-10-CM | POA: Diagnosis not present

## 2017-02-25 DIAGNOSIS — S40011A Contusion of right shoulder, initial encounter: Secondary | ICD-10-CM | POA: Diagnosis not present

## 2017-02-25 DIAGNOSIS — S42291A Other displaced fracture of upper end of right humerus, initial encounter for closed fracture: Secondary | ICD-10-CM | POA: Diagnosis not present

## 2017-02-25 LAB — POC URINALSYSI DIPSTICK (AUTOMATED)
Bilirubin, UA: NEGATIVE
Blood, UA: NEGATIVE
GLUCOSE UA: NEGATIVE
Ketones, UA: NEGATIVE
NITRITE UA: NEGATIVE
PROTEIN UA: NEGATIVE
Spec Grav, UA: 1.025 (ref 1.010–1.025)
UROBILINOGEN UA: 0.2 U/dL
pH, UA: 6 (ref 5.0–8.0)

## 2017-02-25 NOTE — Patient Instructions (Addendum)
We will call about your referral- see the front on the way out.  Use the sling in the meantime.  Ice and tylenol for pain.   Take care.  Glad to see you.

## 2017-02-25 NOTE — Assessment & Plan Note (Signed)
Images reviewed, fracture noted. Discussed with radiology. Placed in sling. She felt better in sling. Anatomy discussed with patient. Refer to [employer]. Appreciate help all involved. Family is supportive, checking on patient now. Okay for outpatient follow-up with family support. >25 minutes spent in face to face time with patient, >50% spent in counselling or coordination of care.

## 2017-02-25 NOTE — Progress Notes (Signed)
Fell PM on 02/22/17.  At her home, on carpet, was alone.  Uses a walker at baseline, prior to the fall.  She didn't feel like she was hurt initially.  No LOC.  She called family afterward and sounder normal.  Complaints of pain and more bruising now.  She has family at home temporarily.    Urinary urgency noted.  No fever, no abd pain.   Meds, vitals, and allergies reviewed.   ROS: Per HPI unless specifically indicated in ROS section   nad but uncomfortable from R shoulder pain Neck supple rrr ctab abd not ttp R shoulder with pain on ext rotation but not in rotation.  Not ttp at the R shoulder joint itself.  R biceps area bruised.   Normal elbow ROM, normal pronation/supination.

## 2017-02-25 NOTE — Assessment & Plan Note (Signed)
She could not give a urine sample at the office visit initially, returned to give a sample. See notes on UA. Urine culture pending. Would hold on antibiotics at this point.

## 2017-02-26 DIAGNOSIS — H1032 Unspecified acute conjunctivitis, left eye: Secondary | ICD-10-CM | POA: Diagnosis not present

## 2017-02-27 LAB — URINE CULTURE: ORGANISM ID, BACTERIA: NO GROWTH

## 2017-03-18 ENCOUNTER — Other Ambulatory Visit: Payer: Self-pay

## 2017-03-26 DIAGNOSIS — S42291D Other displaced fracture of upper end of right humerus, subsequent encounter for fracture with routine healing: Secondary | ICD-10-CM | POA: Diagnosis not present

## 2017-03-29 DIAGNOSIS — S42291G Other displaced fracture of upper end of right humerus, subsequent encounter for fracture with delayed healing: Secondary | ICD-10-CM | POA: Diagnosis not present

## 2017-04-02 DIAGNOSIS — S42291G Other displaced fracture of upper end of right humerus, subsequent encounter for fracture with delayed healing: Secondary | ICD-10-CM | POA: Diagnosis not present

## 2017-04-04 DIAGNOSIS — S42291G Other displaced fracture of upper end of right humerus, subsequent encounter for fracture with delayed healing: Secondary | ICD-10-CM | POA: Diagnosis not present

## 2017-04-11 DIAGNOSIS — S42291G Other displaced fracture of upper end of right humerus, subsequent encounter for fracture with delayed healing: Secondary | ICD-10-CM | POA: Diagnosis not present

## 2017-04-16 DIAGNOSIS — S42291G Other displaced fracture of upper end of right humerus, subsequent encounter for fracture with delayed healing: Secondary | ICD-10-CM | POA: Diagnosis not present

## 2017-04-18 DIAGNOSIS — S42291G Other displaced fracture of upper end of right humerus, subsequent encounter for fracture with delayed healing: Secondary | ICD-10-CM | POA: Diagnosis not present

## 2017-05-20 DIAGNOSIS — L570 Actinic keratosis: Secondary | ICD-10-CM | POA: Diagnosis not present

## 2017-05-20 DIAGNOSIS — Z85828 Personal history of other malignant neoplasm of skin: Secondary | ICD-10-CM | POA: Diagnosis not present

## 2017-05-20 DIAGNOSIS — L578 Other skin changes due to chronic exposure to nonionizing radiation: Secondary | ICD-10-CM | POA: Diagnosis not present

## 2017-05-20 DIAGNOSIS — C44629 Squamous cell carcinoma of skin of left upper limb, including shoulder: Secondary | ICD-10-CM | POA: Diagnosis not present

## 2017-05-20 DIAGNOSIS — C44622 Squamous cell carcinoma of skin of right upper limb, including shoulder: Secondary | ICD-10-CM | POA: Diagnosis not present

## 2017-06-20 DIAGNOSIS — Z23 Encounter for immunization: Secondary | ICD-10-CM | POA: Diagnosis not present

## 2017-07-23 DIAGNOSIS — L578 Other skin changes due to chronic exposure to nonionizing radiation: Secondary | ICD-10-CM | POA: Diagnosis not present

## 2017-07-23 DIAGNOSIS — D692 Other nonthrombocytopenic purpura: Secondary | ICD-10-CM | POA: Diagnosis not present

## 2017-07-23 DIAGNOSIS — L570 Actinic keratosis: Secondary | ICD-10-CM | POA: Diagnosis not present

## 2017-07-23 DIAGNOSIS — Z85828 Personal history of other malignant neoplasm of skin: Secondary | ICD-10-CM | POA: Diagnosis not present

## 2017-07-23 DIAGNOSIS — C44729 Squamous cell carcinoma of skin of left lower limb, including hip: Secondary | ICD-10-CM | POA: Diagnosis not present

## 2017-08-06 DIAGNOSIS — Z85828 Personal history of other malignant neoplasm of skin: Secondary | ICD-10-CM | POA: Diagnosis not present

## 2017-08-06 DIAGNOSIS — L570 Actinic keratosis: Secondary | ICD-10-CM | POA: Diagnosis not present

## 2017-09-03 DIAGNOSIS — L570 Actinic keratosis: Secondary | ICD-10-CM | POA: Diagnosis not present

## 2017-09-03 DIAGNOSIS — Z85828 Personal history of other malignant neoplasm of skin: Secondary | ICD-10-CM | POA: Diagnosis not present

## 2017-09-03 DIAGNOSIS — L309 Dermatitis, unspecified: Secondary | ICD-10-CM | POA: Diagnosis not present

## 2017-09-11 DIAGNOSIS — H353131 Nonexudative age-related macular degeneration, bilateral, early dry stage: Secondary | ICD-10-CM | POA: Diagnosis not present

## 2017-09-13 ENCOUNTER — Emergency Department: Payer: Medicare Other

## 2017-09-13 ENCOUNTER — Emergency Department
Admission: EM | Admit: 2017-09-13 | Discharge: 2017-09-13 | Disposition: A | Discharge status: Home / Self Care | Payer: Medicare Other | Attending: Emergency Medicine | Admitting: Emergency Medicine

## 2017-09-13 ENCOUNTER — Other Ambulatory Visit: Payer: Self-pay

## 2017-09-13 ENCOUNTER — Encounter: Payer: Self-pay | Admitting: Emergency Medicine

## 2017-09-13 ENCOUNTER — Ambulatory Visit: Payer: Self-pay | Admitting: *Deleted

## 2017-09-13 DIAGNOSIS — J449 Chronic obstructive pulmonary disease, unspecified: Secondary | ICD-10-CM | POA: Insufficient documentation

## 2017-09-13 DIAGNOSIS — Z96641 Presence of right artificial hip joint: Secondary | ICD-10-CM | POA: Insufficient documentation

## 2017-09-13 DIAGNOSIS — R079 Chest pain, unspecified: Secondary | ICD-10-CM | POA: Diagnosis not present

## 2017-09-13 DIAGNOSIS — Z79899 Other long term (current) drug therapy: Secondary | ICD-10-CM | POA: Insufficient documentation

## 2017-09-13 DIAGNOSIS — R109 Unspecified abdominal pain: Secondary | ICD-10-CM

## 2017-09-13 DIAGNOSIS — K5730 Diverticulosis of large intestine without perforation or abscess without bleeding: Secondary | ICD-10-CM | POA: Diagnosis not present

## 2017-09-13 DIAGNOSIS — R1032 Left lower quadrant pain: Secondary | ICD-10-CM | POA: Insufficient documentation

## 2017-09-13 LAB — COMPREHENSIVE METABOLIC PANEL
ALK PHOS: 79 U/L (ref 38–126)
ALT: 27 U/L (ref 14–54)
ANION GAP: 12 (ref 5–15)
AST: 31 U/L (ref 15–41)
Albumin: 4 g/dL (ref 3.5–5.0)
BUN: 32 mg/dL — ABNORMAL HIGH (ref 6–20)
CALCIUM: 9.1 mg/dL (ref 8.9–10.3)
CHLORIDE: 103 mmol/L (ref 101–111)
CO2: 23 mmol/L (ref 22–32)
Creatinine, Ser: 1.01 mg/dL — ABNORMAL HIGH (ref 0.44–1.00)
GFR calc non Af Amer: 46 mL/min — ABNORMAL LOW (ref >60)
GFR, EST AFRICAN AMERICAN: 53 mL/min — AB (ref >60)
Glucose, Bld: 120 mg/dL — ABNORMAL HIGH (ref 65–99)
POTASSIUM: 4.1 mmol/L (ref 3.5–5.1)
SODIUM: 138 mmol/L (ref 135–145)
Total Bilirubin: 0.7 mg/dL (ref 0.3–1.2)
Total Protein: 7.1 g/dL (ref 6.5–8.1)

## 2017-09-13 LAB — URINALYSIS, COMPLETE (UACMP) WITH MICROSCOPIC
BILIRUBIN URINE: NEGATIVE
Bacteria, UA: NONE SEEN
GLUCOSE, UA: NEGATIVE mg/dL
Hgb urine dipstick: NEGATIVE
KETONES UR: NEGATIVE mg/dL
Leukocytes, UA: NEGATIVE
Nitrite: NEGATIVE
PH: 7 (ref 5.0–8.0)
Protein, ur: NEGATIVE mg/dL
Specific Gravity, Urine: 1.013 (ref 1.005–1.030)

## 2017-09-13 LAB — CBC
HEMATOCRIT: 37.4 % (ref 35.0–47.0)
HEMOGLOBIN: 12.7 g/dL (ref 12.0–16.0)
MCH: 32.3 pg (ref 26.0–34.0)
MCHC: 34 g/dL (ref 32.0–36.0)
MCV: 95.2 fL (ref 80.0–100.0)
Platelets: 181 10*3/uL (ref 150–440)
RBC: 3.92 MIL/uL (ref 3.80–5.20)
RDW: 14 % (ref 11.5–14.5)
WBC: 6.1 10*3/uL (ref 3.6–11.0)

## 2017-09-13 LAB — LIPASE, BLOOD: Lipase: 34 U/L (ref 11–51)

## 2017-09-13 LAB — TROPONIN I: Troponin I: <0.03 ng/mL (ref <0.03)

## 2017-09-13 MED ORDER — ACETAMINOPHEN 325 MG PO TABS
650.0000 mg | ORAL_TABLET | Freq: Once | ORAL | Status: AC
  Administered 2017-09-13: 650 mg via ORAL
  Filled 2017-09-13: qty 2

## 2017-09-13 MED ORDER — FENTANYL CITRATE (PF) 100 MCG/2ML IJ SOLN
25.0000 ug | Freq: Once | INTRAMUSCULAR | Status: DC
  Filled 2017-09-13: qty 2

## 2017-09-13 NOTE — ED Notes (Signed)
Pt transported to xray 

## 2017-09-13 NOTE — Telephone Encounter (Signed)
Noted  Pt needs to go to ER via EMS.

## 2017-09-13 NOTE — ED Provider Notes (Addendum)
[facility] Emergency Department Provider Note  ____________________________________________   I have reviewed the triage vital signs and the nursing notes. Where available I have reviewed prior notes and, if possible and indicated, outside hospital notes.    HISTORY  Chief Complaint Abdominal Pain    HPI Toni Henry is a 84 y.o. female who presents today complaining of in her left abdomen only when she bends over, not at any other time.  Is been there for 3 or 4 days.  She denies any fever chills nausea vomiting dysuria urinary frequency, change in her bowel habits, she denies chest pain shortness of breath exertional symptoms, gets a sharp nonradiating discomfort which is there only when she bends over, no prior treatment, at this time she has no pain at all.     Past Medical History:  Diagnosis Date  . Allergic rhinitis due to pollen   . GERD (gastroesophageal reflux disease)   . Macular degeneration   . Osteoporosis     Patient Active Problem List   Diagnosis Date Noted  . Right shoulder pain 02/25/2017  . Urine frequency 02/25/2017  . Constipation 02/21/2017  . Low back sprain, initial encounter 02/12/2017  . Preventative health care 02/07/2016  . Advance directive discussed with patient 02/07/2016  . COPD (chronic obstructive pulmonary disease) with emphysema ([place]) 01/19/2016  . Dry mouth 02/03/2014  . Mild malnutrition ([place]) 02/03/2014  . Cervical osteoarthritis 05/28/2013  . Sleep disturbance 11/12/2012  . Other osteoporosis   . GERD (gastroesophageal reflux disease)   . Allergic rhinitis due to pollen   . Macular degeneration     Past Surgical History:  Procedure Laterality Date  . ABDOMINAL HYSTERECTOMY    . HIP FRACTURE SURGERY Left 1/14  . TOTAL HIP ARTHROPLASTY Right ~2010    Prior to Admission medications   Medication Sig Start Date End Date Taking? Authorizing Provider  Cholecalciferol (VITAMIN D3) 2000 UNITS TABS Take 1  tablet by mouth daily.    [provider]  Lutein 10 MG TABS Take 1 tablet by mouth daily.    [provider]  Multiple Vitamin (MULTIVITAMIN) capsule Take 1 capsule by mouth daily.    [provider]  polyethylene glycol (MIRALAX / GLYCOLAX) packet Take 17 g by mouth daily as needed.    [provider]  tiotropium (SPIRIVA) 18 MCG inhalation capsule Place 1 capsule (18 mcg total) into inhaler and inhale daily. 08/13/16   Venia Carbon, MD    Allergies Patient has no known allergies.  No family history on file.  Social History Social History   Tobacco Use  . Smoking status: Never Smoker  . Smokeless tobacco: Never Used  Substance Use Topics  . Alcohol use: Yes  . Drug use: No    Review of Systems Constitutional: No fever/chills Eyes: No visual changes. ENT: No sore throat. No stiff neck no neck pain Cardiovascular: Denies chest pain. Respiratory: Denies shortness of breath. Gastrointestinal:   no vomiting.  No diarrhea.  No constipation. Genitourinary: Negative for dysuria. Musculoskeletal: Negative lower extremity swelling Skin: Negative for rash. Neurological: Negative for severe headaches, focal weakness or numbness.   ____________________________________________   PHYSICAL EXAM:  VITAL SIGNS: ED Triage Vitals  Enc Vitals Group     BP 09/13/17 1655 (!) 146/95     Pulse Rate 09/13/17 1653 90     Resp 09/13/17 1653 20     Temp 09/13/17 1653 98.1 F (36.7 C)     Temp Source 09/13/17  1653 Oral     SpO2 09/13/17 1651 96 %     Weight --      Height --      Head Circumference --      Peak Flow --      Pain Score --      Pain Loc --      Pain Edu? --      Excl. in [place]? --     Constitutional: Alert and oriented. Well appearing and in no acute distress. Eyes: Conjunctivae are normal Head: Atraumatic HEENT: No congestion/rhinnorhea. Mucous membranes are moist.  Oropharynx non-erythematous Neck:   Nontender with no  meningismus, no masses, no stridor Cardiovascular: Normal rate, regular rhythm. Grossly normal heart sounds.  Good peripheral circulation. Respiratory: Normal respiratory effort.  No retractions. Lungs CTAB. Abdominal: Soft and slight tenderness to palpation in the left side of the abdomen. No distention. No guarding no rebound Back:  There is no focal tenderness or step off.  there is no midline tenderness there are no lesions noted. there is no CVA tenderness Musculoskeletal: No lower extremity tenderness, no upper extremity tenderness. No joint effusions, no DVT signs strong distal pulses no edema Neurologic:  Normal speech and language. No gross focal neurologic deficits are appreciated.  Skin:  Skin is warm, dry and intact. No rash noted. Psychiatric: Mood and affect are normal. Speech and behavior are normal.  ____________________________________________   LABS (all labs ordered are listed, but only abnormal results are displayed)  Labs Reviewed  COMPREHENSIVE METABOLIC PANEL - Abnormal; Notable for the following components:      Result Value   Glucose, Bld 120 (*)    BUN 32 (*)    Creatinine, Ser 1.01 (*)    GFR calc non Af Amer 46 (*)    GFR calc Af Amer 53 (*)    All other components within normal limits  LIPASE, BLOOD  CBC  URINALYSIS, COMPLETE (UACMP) WITH MICROSCOPIC  TROPONIN I    Pertinent labs  results that were available during my care of the patient were reviewed by me and considered in my medical decision making (see chart for details). ____________________________________________  EKG  I personally interpreted any EKGs ordered by me or triage Normal sinus rhythm 84 bpm no acute ST elevation or depression, no acute ischemic changes ____________________________________________  RADIOLOGY  Pertinent labs & imaging results that were available during my care of the patient were reviewed by me and considered in my medical decision making (see chart for details).  If possible, patient and/or family made aware of any abnormal findings.  Dg Chest 2 View  Result Date: 09/13/2017 CLINICAL DATA:  Left-sided chest pain. EXAM: CHEST  2 VIEW COMPARISON:  Radiographs of August 13, 2016. FINDINGS: Stable cardiomediastinal silhouette. Atherosclerosis of thoracic aorta is noted. No pneumothorax is noted. Stable old compression fractures are noted in the thoracic spine. Minimal bilateral pleural effusions are noted. No consolidative process is noted. IMPRESSION: Minimal bilateral pleural effusions. Aortic atherosclerosis. No other significant abnormality seen. Electronically Signed   By: Marijo Conception, M.D.   On: 09/13/2017 17:24   ____________________________________________    PROCEDURES  Procedure(s) performed: None  Procedures  Critical Care performed: None  ____________________________________________   INITIAL IMPRESSION / ASSESSMENT AND PLAN / ED COURSE  Pertinent labs & imaging results that were available during my care of the patient were reviewed by me and considered in my medical decision making (see chart for details).  Patient here with reproducible, mild discomfort  on the left side, associated with only bending over she states.  She is otherwise in no acute distress her chief complaint really is that she is thirsty and wants water.  Given her age I am doing a CT scan of her abdomen pelvis, chest x-ray blood work urine and we will reassess.  ----------------------------------------- 7:04 PM on 09/13/2017 -----------------------------------------  Very reassuring workup from all points of view, at this time, there does not appear to be clinical evidence to support the diagnosis of pulmonary embolus, dissection, myocarditis, endocarditis, pericarditis, pericardial tamponade, acute coronary syndrome, pneumothorax, pneumonia, or any other acute intrathoracic pathology that will require admission or acute intervention. Nor is there evidence of  any significant intra-abdominal pathology causing this discomfort.  Considering the patient's symptoms, medical history, and physical examination today, I have low suspicion for cholecystitis or biliary pathology, pancreatitis, perforation or bowel obstruction, hernia, intra-abdominal abscess, AAA or dissection, volvulus or intussusception, mesenteric ischemia, ischemic gut, pyelonephritis or appendicitis.  Return precautions and follow-up given and understood patient has no pain or discomfort at this time she is with family and they have asked me multiple times if it is "okay to leave".  We will discharge him advising hydration and return precautions.      ____________________________________________   FINAL CLINICAL IMPRESSION(S) / ED DIAGNOSES  Final diagnoses:  None      This chart was dictated using voice recognition software.  Despite best efforts to proofread,  errors can occur which can change meaning.      Schuyler Amor, MD 09/13/17 1747    Schuyler Amor, MD 09/13/17 [phone]

## 2017-09-13 NOTE — Telephone Encounter (Signed)
Pt contact # consistently busy. Dr Silvio Pate out of office. Per Ms Valora Piccolo RN 911 was called while pt was on phone.

## 2017-09-13 NOTE — ED Triage Notes (Signed)
Pt to ED via [facility] from h ome for c/o abd pain. Pt denies N/V. Pt in NAD at this time. PT denies urinary symptoms .

## 2017-09-13 NOTE — ED Notes (Signed)
Pt given water to drink. 

## 2017-09-13 NOTE — Telephone Encounter (Signed)
Toni Henry phoned in with severe left-sided abdomen pain She described the location as under her Lt. breast. She was crying and panic could be heard in her voice. 911 was contacted with the patient on the line.

## 2017-09-13 NOTE — ED Notes (Signed)

## 2017-09-16 ENCOUNTER — Other Ambulatory Visit: Payer: Self-pay | Admitting: Internal Medicine

## 2017-09-18 ENCOUNTER — Telehealth: Payer: Self-pay

## 2017-09-18 NOTE — Telephone Encounter (Signed)
Copied from [place]. Topic: Inquiry >> Sep 18, 2017  1:53 PM Conception Chancy, NT wrote: Pt daughter is calling Toni Henry ) and states pt has a ER follow up on Friday 1/25 and Toni Henry can not physically be there and let Dr. Silvio Pate know what is going on so wanted to know if she could listen to the appt by phone. Please advise and contact Toni Henry back. Thank you.

## 2017-09-19 NOTE — Telephone Encounter (Signed)
Please let her know that it would be fine for her mom to call her and put her on speaker phone while she is here. Does she have any specific concerns I should know about before the appt?

## 2017-09-19 NOTE — Telephone Encounter (Signed)
Spoke to [place]. She may actually be able to make it. But, just in case, she wanted to let Dr Silvio Pate know her memory has faded drastically in the last few months. Yesterday, the pt called Izora Gala to ask if she was taking her the doctor appt then called her today and asked the same thing. She does not remember calling.

## 2017-09-19 NOTE — Telephone Encounter (Signed)
Okay ---will keep this in mind and all the more important that daughter be present by phone, if not in person

## 2017-09-20 ENCOUNTER — Encounter: Payer: Self-pay | Admitting: Internal Medicine

## 2017-09-20 ENCOUNTER — Ambulatory Visit (INDEPENDENT_AMBULATORY_CARE_PROVIDER_SITE_OTHER): Payer: Medicare Other | Admitting: Internal Medicine

## 2017-09-20 VITALS — BP 132/80 | HR 67 | Temp 97.4°F | Wt 88.0 lb

## 2017-09-20 DIAGNOSIS — R10A2 Flank pain, left side: Secondary | ICD-10-CM | POA: Insufficient documentation

## 2017-09-20 DIAGNOSIS — R109 Unspecified abdominal pain: Secondary | ICD-10-CM

## 2017-09-20 DIAGNOSIS — F0390 Unspecified dementia without behavioral disturbance: Secondary | ICD-10-CM | POA: Diagnosis not present

## 2017-09-20 NOTE — Assessment & Plan Note (Signed)
Seems to be rib related No findings to explain this on CT scan or CXR No true trauma---and seems to get some relief from splinting Suggested trying a velcro brace to support ribs

## 2017-09-20 NOTE — Progress Notes (Signed)
   Subjective:    Patient ID: Toni Henry, female    DOB: 03-25-1920, 84 y.o.   MRN: [id number]  HPI Here with daughter for ER follow up Reviewed the notes  Left side pain--lateral to abdomen Shoots to back Sometimes up to left shoulder Will double over and grab side Fairly frequent--like every 10 minutes gets worse but "it is there all the time" No nausea Eating okay Bowels moving fine No blood in urine or dysuria  No known injury Aleve does help  Memory is worse in past few months Forgot that daughter had visited 2 days after Misplacing things  Current Outpatient Medications on File Prior to Visit  Medication Sig Dispense Refill  . Cholecalciferol (VITAMIN D3) 2000 UNITS TABS Take 1 tablet by mouth daily.    . Lutein 10 MG TABS Take 1 tablet by mouth daily.    . Multiple Vitamin (MULTIVITAMIN) capsule Take 1 capsule by mouth daily.    Marland Kitchen SPIRIVA HANDIHALER 18 MCG inhalation capsule USE ONCE DAILY 30 capsule 11   No current facility-administered medications on file prior to visit.     No Known Allergies  Past Medical History:  Diagnosis Date  . Allergic rhinitis due to pollen   . GERD (gastroesophageal reflux disease)   . Macular degeneration   . Osteoporosis     Past Surgical History:  Procedure Laterality Date  . ABDOMINAL HYSTERECTOMY    . HIP FRACTURE SURGERY Left 1/14  . TOTAL HIP ARTHROPLASTY Right ~2010    History reviewed. No pertinent family history.  Social History   Socioeconomic History  . Marital status: Widowed    Spouse name: Not on file  . Number of children: 2  . Years of education: Not on file  . Highest education level: Not on file  Social Needs  . Financial resource strain: Not on file  . Food insecurity - worry: Not on file  . Food insecurity - inability: Not on file  . Transportation needs - medical: Not on file  . Transportation needs - non-medical: Not on file  Occupational History  . Occupation: Radio producer    Comment:  Retired  Tobacco Use  . Smoking status: Never Smoker  . Smokeless tobacco: Never Used  Substance and Sexual Activity  . Alcohol use: Yes  . Drug use: No  . Sexual activity: Not on file  Other Topics Concern  . Not on file  Social History Narrative   Widowed ~2012   2 daughters      Has living will   Designated SIL [facility] Bernardy as health care POA   Requests DNR--form done 11/12/12   Would not accept feeding tube   Review of Systems No fever Sleeping okay--some trouble initiating Some cough Feels SOB----?worse Lots of wheezing--uses inhaler (but not sure how consistently or technically well)    Objective:   Physical Exam  Constitutional: No distress.  Cardiovascular: Normal rate, regular rhythm and normal heart sounds. Exam reveals no gallop.  No murmur heard. Pulmonary/Chest: Effort normal and breath sounds normal. No respiratory distress. She has no wheezes. She has no rales.  Tenderness along anterior left lower ribs--but not posteriorly Has kyphosis  Abdominal: Soft. She exhibits no distension. There is no tenderness. There is no rebound and no guarding.          Assessment & Plan:

## 2017-09-20 NOTE — Assessment & Plan Note (Signed)
Clear changes that are more concerning Has social isolation, hearing loss--that may be amenable to improvement Is getting meals Doesn't want to go to [place] Asked daughter to contact [place] RNs to keep an eye on her Needs increased time with other people

## 2017-09-23 DIAGNOSIS — C44722 Squamous cell carcinoma of skin of right lower limb, including hip: Secondary | ICD-10-CM | POA: Diagnosis not present

## 2017-09-23 DIAGNOSIS — L821 Other seborrheic keratosis: Secondary | ICD-10-CM | POA: Diagnosis not present

## 2017-09-23 DIAGNOSIS — L300 Nummular dermatitis: Secondary | ICD-10-CM | POA: Diagnosis not present

## 2017-09-23 DIAGNOSIS — Z85828 Personal history of other malignant neoplasm of skin: Secondary | ICD-10-CM | POA: Diagnosis not present

## 2017-09-23 DIAGNOSIS — D485 Neoplasm of uncertain behavior of skin: Secondary | ICD-10-CM | POA: Diagnosis not present

## 2017-09-30 ENCOUNTER — Telehealth: Payer: Self-pay | Admitting: Internal Medicine

## 2017-09-30 NOTE — Telephone Encounter (Signed)
Copied from [place] [id number]. Topic: Quick Communication - See Telephone Encounter >> Sep 30, 2017 12:18 PM Vernona Rieger wrote: CRM for notification. See Telephone encounter for:   09/30/17.   Dr Melynda Keller DDS called and stated that she takes amoxicillin as a pre-med, they want to know does she still need to take it? Per office, patient has an old script. Call back is [phone] Jeannene Patella)

## 2017-09-30 NOTE — Telephone Encounter (Signed)
I am not sure what she would be taking it for---?the THR about 9 years ago. I don't think she likely needs this anymore

## 2017-10-01 NOTE — Telephone Encounter (Signed)
Spoke to [employer]. Advised her Dr Silvio Pate did not think she needed a pre-antibiotic

## 2017-10-15 DIAGNOSIS — D692 Other nonthrombocytopenic purpura: Secondary | ICD-10-CM | POA: Diagnosis not present

## 2017-10-15 DIAGNOSIS — H2512 Age-related nuclear cataract, left eye: Secondary | ICD-10-CM | POA: Diagnosis not present

## 2017-10-15 DIAGNOSIS — L821 Other seborrheic keratosis: Secondary | ICD-10-CM | POA: Diagnosis not present

## 2017-10-15 DIAGNOSIS — L300 Nummular dermatitis: Secondary | ICD-10-CM | POA: Diagnosis not present

## 2017-10-15 DIAGNOSIS — L820 Inflamed seborrheic keratosis: Secondary | ICD-10-CM | POA: Diagnosis not present

## 2017-10-15 DIAGNOSIS — Z85828 Personal history of other malignant neoplasm of skin: Secondary | ICD-10-CM | POA: Diagnosis not present

## 2017-10-16 ENCOUNTER — Encounter: Payer: Self-pay | Admitting: *Deleted

## 2017-10-30 ENCOUNTER — Encounter
Admission: RE | Disposition: A | Discharge status: Home / Self Care | Payer: Self-pay | Source: Ambulatory Visit | Attending: Ophthalmology

## 2017-10-30 ENCOUNTER — Ambulatory Visit: Payer: Medicare Other | Admitting: Certified Registered Nurse Anesthetist

## 2017-10-30 ENCOUNTER — Ambulatory Visit
Admission: RE | Admit: 2017-10-30 | Discharge: 2017-10-30 | Disposition: A | Discharge status: Home / Self Care | Payer: Medicare Other | Source: Ambulatory Visit | Attending: Ophthalmology | Admitting: Ophthalmology

## 2017-10-30 ENCOUNTER — Encounter: Payer: Self-pay | Admitting: Certified Registered Nurse Anesthetist

## 2017-10-30 ENCOUNTER — Other Ambulatory Visit: Payer: Self-pay

## 2017-10-30 DIAGNOSIS — K219 Gastro-esophageal reflux disease without esophagitis: Secondary | ICD-10-CM | POA: Insufficient documentation

## 2017-10-30 DIAGNOSIS — J439 Emphysema, unspecified: Secondary | ICD-10-CM | POA: Diagnosis not present

## 2017-10-30 DIAGNOSIS — F0390 Unspecified dementia without behavioral disturbance: Secondary | ICD-10-CM | POA: Insufficient documentation

## 2017-10-30 DIAGNOSIS — Z79899 Other long term (current) drug therapy: Secondary | ICD-10-CM | POA: Insufficient documentation

## 2017-10-30 DIAGNOSIS — H2512 Age-related nuclear cataract, left eye: Secondary | ICD-10-CM | POA: Insufficient documentation

## 2017-10-30 DIAGNOSIS — J449 Chronic obstructive pulmonary disease, unspecified: Secondary | ICD-10-CM | POA: Diagnosis not present

## 2017-10-30 DIAGNOSIS — K5730 Diverticulosis of large intestine without perforation or abscess without bleeding: Secondary | ICD-10-CM | POA: Diagnosis not present

## 2017-10-30 DIAGNOSIS — J301 Allergic rhinitis due to pollen: Secondary | ICD-10-CM | POA: Diagnosis not present

## 2017-10-30 HISTORY — DX: Edema, unspecified: R60.9

## 2017-10-30 HISTORY — DX: Dry mouth, unspecified: R68.2

## 2017-10-30 HISTORY — DX: Other amnesia: R41.3

## 2017-10-30 HISTORY — DX: Malignant (primary) neoplasm, unspecified: C80.1

## 2017-10-30 HISTORY — DX: Unspecified hearing loss, unspecified ear: H91.90

## 2017-10-30 HISTORY — PX: CATARACT EXTRACTION W/PHACO: SHX586

## 2017-10-30 HISTORY — DX: Wheezing: R06.2

## 2017-10-30 HISTORY — DX: Dyspnea, unspecified: R06.00

## 2017-10-30 SURGERY — PHACOEMULSIFICATION, CATARACT, WITH IOL INSERTION
Anesthesia: Monitor Anesthesia Care | Site: Eye | Laterality: Left | Wound class: Clean

## 2017-10-30 MED ORDER — ARMC OPHTHALMIC DILATING DROPS
1.0000 "application " | OPHTHALMIC | Status: AC
  Administered 2017-10-30 (×3): 1 via OPHTHALMIC

## 2017-10-30 MED ORDER — FENTANYL CITRATE (PF) 100 MCG/2ML IJ SOLN
INTRAMUSCULAR | Status: DC | PRN
  Administered 2017-10-30: 25 ug via INTRAVENOUS

## 2017-10-30 MED ORDER — MOXIFLOXACIN HCL 0.5 % OP SOLN
OPHTHALMIC | Status: AC
  Filled 2017-10-30: qty 3

## 2017-10-30 MED ORDER — MOXIFLOXACIN HCL 0.5 % OP SOLN: 1.0000 [drp] | OPHTHALMIC | Status: DC | PRN

## 2017-10-30 MED ORDER — MOXIFLOXACIN HCL 0.5 % OP SOLN
OPHTHALMIC | Status: DC | PRN
  Administered 2017-10-30: 0.2 mL via OPHTHALMIC

## 2017-10-30 MED ORDER — FENTANYL CITRATE (PF) 100 MCG/2ML IJ SOLN
INTRAMUSCULAR | Status: AC
  Filled 2017-10-30: qty 2

## 2017-10-30 MED ORDER — SODIUM CHLORIDE 0.9 % IV SOLN
INTRAVENOUS | Status: DC
  Administered 2017-10-30: 08:00:00 via INTRAVENOUS

## 2017-10-30 MED ORDER — LIDOCAINE HCL (PF) 4 % IJ SOLN
INTRAMUSCULAR | Status: AC
  Filled 2017-10-30: qty 5

## 2017-10-30 MED ORDER — MIDAZOLAM HCL 2 MG/2ML IJ SOLN
INTRAMUSCULAR | Status: AC
  Filled 2017-10-30: qty 2

## 2017-10-30 MED ORDER — EPINEPHRINE PF 1 MG/ML IJ SOLN
INTRAMUSCULAR | Status: AC
  Filled 2017-10-30: qty 2

## 2017-10-30 MED ORDER — POVIDONE-IODINE 5 % OP SOLN
OPHTHALMIC | Status: AC
  Filled 2017-10-30: qty 30

## 2017-10-30 MED ORDER — NA CHONDROIT SULF-NA HYALURON 40-17 MG/ML IO SOLN
INTRAOCULAR | Status: DC | PRN
  Administered 2017-10-30: 1 mL via INTRAOCULAR

## 2017-10-30 MED ORDER — POVIDONE-IODINE 5 % OP SOLN
OPHTHALMIC | Status: DC | PRN
  Administered 2017-10-30: 1 via OPHTHALMIC

## 2017-10-30 MED ORDER — CARBACHOL 0.01 % IO SOLN
INTRAOCULAR | Status: DC | PRN
  Administered 2017-10-30: 0.5 mL via INTRAOCULAR

## 2017-10-30 MED ORDER — ARMC OPHTHALMIC DILATING DROPS
OPHTHALMIC | Status: AC
  Administered 2017-10-30: 1 via OPHTHALMIC
  Filled 2017-10-30: qty 0.4

## 2017-10-30 MED ORDER — BSS IO SOLN
INTRAOCULAR | Status: DC | PRN
  Administered 2017-10-30: 4 mL via OPHTHALMIC

## 2017-10-30 MED ORDER — BSS IO SOLN
INTRAOCULAR | Status: DC | PRN
  Administered 2017-10-30: 08:00:00 via OPHTHALMIC

## 2017-10-30 MED ORDER — NA CHONDROIT SULF-NA HYALURON 40-17 MG/ML IO SOLN
INTRAOCULAR | Status: AC
  Filled 2017-10-30: qty 1

## 2017-10-30 SURGICAL SUPPLY — 16 items
GLOVE BIO SURGEON STRL SZ8 (GLOVE) ×3 IMPLANT
GLOVE BIOGEL M 6.5 STRL (GLOVE) ×3 IMPLANT
GLOVE SURG LX 8.0 MICRO (GLOVE) ×2
GLOVE SURG LX STRL 8.0 MICRO (GLOVE) ×1 IMPLANT
GOWN STRL REUS W/ TWL LRG LVL3 (GOWN DISPOSABLE) ×2 IMPLANT
GOWN STRL REUS W/TWL LRG LVL3 (GOWN DISPOSABLE) ×4
LABEL CATARACT MEDS ST (LABEL) ×3 IMPLANT
LENS IOL TECNIS ITEC 23.0 (Intraocular Lens) ×3 IMPLANT
PACK CATARACT (MISCELLANEOUS) ×3 IMPLANT
PACK CATARACT BRASINGTON LX (MISCELLANEOUS) ×3 IMPLANT
PACK EYE AFTER SURG (MISCELLANEOUS) ×3 IMPLANT
SOL BSS BAG (MISCELLANEOUS) ×3
SOLUTION BSS BAG (MISCELLANEOUS) ×1 IMPLANT
SYR 5ML LL (SYRINGE) ×3 IMPLANT
WATER STERILE IRR 250ML POUR (IV SOLUTION) ×3 IMPLANT
WIPE NON LINTING 3.25X3.25 (MISCELLANEOUS) ×3 IMPLANT

## 2017-10-30 NOTE — Anesthesia Post-op Follow-up Note (Signed)
Anesthesia QCDR form completed.        

## 2017-10-30 NOTE — Anesthesia Postprocedure Evaluation (Signed)
Anesthesia Post Note  Patient: Toni Henry  Procedure(s) Performed: CATARACT EXTRACTION PHACO AND INTRAOCULAR LENS PLACEMENT ([place]) (Left Eye)  Patient location during evaluation: PACU Anesthesia Type: MAC Level of consciousness: awake and alert and oriented Pain management: pain level controlled Vital Signs Assessment: post-procedure vital signs reviewed and stable Respiratory status: spontaneous breathing Cardiovascular status: blood pressure returned to baseline Anesthetic complications: no     Last Vitals:  Vitals:   10/30/17 0828 10/30/17 0838  BP: (!) 151/65 (!) 161/70  Pulse: 64 60  Resp: 18   Temp: 36.4 C   SpO2: 98% 100%    Last Pain:  Vitals:   10/30/17 0714  TempSrc: Oral                 Najeeb Uptain

## 2017-10-30 NOTE — Anesthesia Preprocedure Evaluation (Addendum)
Anesthesia Evaluation  Patient identified by MRN, date of birth, ID band  History of Anesthesia Complications (+) PROLONGED EMERGENCE and history of anesthetic complications  Airway Mallampati: III  TM Distance: <3 FB     Dental  (+) Caps, Poor Dentition, Chipped   Pulmonary shortness of breath and with exertion, COPD,    Pulmonary exam normal        Cardiovascular negative cardio ROS Normal cardiovascular exam     Neuro/Psych PSYCHIATRIC DISORDERS Dementia    GI/Hepatic Neg liver ROS, GERD  ,  Endo/Other  negative endocrine ROS  Renal/GU negative Renal ROS  negative genitourinary   Musculoskeletal  (+) Arthritis , Osteoarthritis,    Abdominal Normal abdominal exam  (+)   Peds negative pediatric ROS (+)  Hematology negative hematology ROS (+)   Anesthesia Other Findings   Reproductive/Obstetrics                            Anesthesia Physical Anesthesia Plan  ASA: III  Anesthesia Plan: MAC   Post-op Pain Management:    Induction:   PONV Risk Score and Plan:   Airway Management Planned: Nasal Cannula  Additional Equipment:   Intra-op Plan:   Post-operative Plan:   Informed Consent: I have reviewed the patients History and Physical, chart, labs and discussed the procedure including the risks, benefits and alternatives for the proposed anesthesia with the patient or authorized representative who has indicated his/her understanding and acceptance.   Dental advisory given  Plan Discussed with: CRNA and Surgeon  Anesthesia Plan Comments:         Anesthesia Quick Evaluation

## 2017-10-30 NOTE — Transfer of Care (Signed)
Immediate Anesthesia Transfer of Care Note  Patient: Toni Henry  Procedure(s) Performed: CATARACT EXTRACTION PHACO AND INTRAOCULAR LENS PLACEMENT ([facility]) (Left Eye)  Patient Location: Short Stay  Anesthesia Type:MAC  Level of Consciousness: awake, alert , oriented and patient cooperative  Airway & Oxygen Therapy: Patient Spontanous Breathing  Post-op Assessment: Report given to RN and Post -op Vital signs reviewed and stable  Post vital signs: Reviewed and stable  Last Vitals:  Vitals:   10/30/17 0714 10/30/17 0828  BP: 134/83 (!) 151/65  Pulse: 64 64  Resp: 18 18  Temp: 36.4 C 36.4 C  SpO2: 98% 98%    Last Pain:  Vitals:   10/30/17 0714  TempSrc: Oral         Complications: No apparent anesthesia complications

## 2017-10-30 NOTE — H&P (Signed)
All labs reviewed. Abnormal studies sent to patients PCP when indicated.  Previous H&P reviewed, patient examined, there are NO CHANGES.  Toni Leitz Porfilio3/6/20198:01 AM

## 2017-10-30 NOTE — Op Note (Signed)
PREOPERATIVE DIAGNOSIS:  Nuclear sclerotic cataract of the left eye.   POSTOPERATIVE DIAGNOSIS:  Nuclear sclerotic cataract of the left eye.   OPERATIVE PROCEDURE: Procedure(s): CATARACT EXTRACTION PHACO AND INTRAOCULAR LENS PLACEMENT ([facility])   SURGEON:  Birder Robson, MD.   ANESTHESIA:  Anesthesiologist: Alvin Critchley, MD CRNA: Eben Burow, CRNA  1.      Managed anesthesia care. 2.     0.37ml of Shugarcaine was instilled following the paracentesis   COMPLICATIONS:  None.   TECHNIQUE:   Stop and chop   DESCRIPTION OF PROCEDURE:  The patient was examined and consented in the preoperative holding area where the aforementioned topical anesthesia was applied to the left eye and then brought back to the Operating Room where the left eye was prepped and draped in the usual sterile ophthalmic fashion and a lid speculum was placed. A paracentesis was created with the side port blade and the anterior chamber was filled with viscoelastic. A near clear corneal incision was performed with the steel keratome. A continuous curvilinear capsulorrhexis was performed with a cystotome followed by the capsulorrhexis forceps. Hydrodissection and hydrodelineation were carried out with BSS on a blunt cannula. The lens was removed in a stop and chop  technique and the remaining cortical material was removed with the irrigation-aspiration handpiece. The capsular bag was inflated with viscoelastic and the Technis ZCB00 lens was placed in the capsular bag without complication. The remaining viscoelastic was removed from the eye with the irrigation-aspiration handpiece. The wounds were hydrated. The anterior chamber was flushed with Miostat and the eye was inflated to physiologic pressure. 0.12ml Vigamox was placed in the anterior chamber. The wounds were found to be water tight. The eye was dressed with Vigamox. The patient was given protective glasses to wear throughout the day and a shield with which to sleep tonight.  The patient was also given drops with which to begin a drop regimen today and will follow-up with me in one day. Implant Name Type Inv. Item Serial No. Manufacturer Lot No. LRB No. Used  LENS IOL DIOP 23.0 - [id number] Intraocular Lens LENS IOL DIOP 23.0 [id number] AMO  Left 1    Procedure(s) with comments: CATARACT EXTRACTION PHACO AND INTRAOCULAR LENS PLACEMENT ([facility]) (Left) - [place] 01:12.4 AP% 13.9 CDE 10.02 Fluid Pack Lot # [id number]  Electronically signed: Birder Robson 10/30/2017 8:26 AM

## 2017-10-30 NOTE — Discharge Instructions (Signed)
Eye Surgery Discharge Instructions  Expect mild scratchy sensation or mild soreness. DO NOT RUB YOUR EYE!  The day of surgery:  Minimal physical activity, but bed rest is not required  No reading, computer work, or close hand work  No bending, lifting, or straining.  May watch TV  For 24 hours:  No driving, legal decisions, or alcoholic beverages  Safety precautions  Eat anything you prefer: It is better to start with liquids, then soup then solid foods.  _____ Eye patch should be worn until postoperative exam tomorrow.  ____ Solar shield eyeglasses should be worn for comfort in the sunlight/patch while sleeping  Resume all regular medications including aspirin or Coumadin if these were discontinued prior to surgery. You may shower, bathe, shave, or wash your hair. Tylenol may be taken for mild discomfort.  Call your doctor if you experience significant pain, nausea, or vomiting, fever > 101 or other signs of infection. [phone] or [phone] Specific instructions:  Follow-up Information    Birder Robson, MD Follow up.   Specialty:  Ophthalmology Why:  10/31/17 at 10:00 Contact information: [address] [place] [address] [phone]          Eye Surgery Discharge Instructions  Expect mild scratchy sensation or mild soreness. DO NOT RUB YOUR EYE!  The day of surgery:  Minimal physical activity, but bed rest is not required  No reading, computer work, or close hand work  No bending, lifting, or straining.  May watch TV  For 24 hours:  No driving, legal decisions, or alcoholic beverages  Safety precautions  Eat anything you prefer: It is better to start with liquids, then soup then solid foods.  _____ Eye patch should be worn until postoperative exam tomorrow.  ____ Solar shield eyeglasses should be worn for comfort in the sunlight/patch while sleeping  Resume all regular medications including aspirin or Coumadin if these were  discontinued prior to surgery. You may shower, bathe, shave, or wash your hair. Tylenol may be taken for mild discomfort.  Call your doctor if you experience significant pain, nausea, or vomiting, fever > 101 or other signs of infection. [phone] or [phone] Specific instructions:  Follow-up Information    Birder Robson, MD Follow up.   Specialty:  Ophthalmology Why:  10/31/17 at 10:00 Contact information: [place] [address] [phone]

## 2017-12-30 DIAGNOSIS — L853 Xerosis cutis: Secondary | ICD-10-CM | POA: Diagnosis not present

## 2017-12-30 DIAGNOSIS — L308 Other specified dermatitis: Secondary | ICD-10-CM | POA: Diagnosis not present

## 2017-12-30 DIAGNOSIS — L570 Actinic keratosis: Secondary | ICD-10-CM | POA: Diagnosis not present

## 2017-12-30 DIAGNOSIS — H2511 Age-related nuclear cataract, right eye: Secondary | ICD-10-CM | POA: Diagnosis not present

## 2017-12-30 DIAGNOSIS — L578 Other skin changes due to chronic exposure to nonionizing radiation: Secondary | ICD-10-CM | POA: Diagnosis not present

## 2017-12-30 DIAGNOSIS — Z85828 Personal history of other malignant neoplasm of skin: Secondary | ICD-10-CM | POA: Diagnosis not present

## 2018-01-02 ENCOUNTER — Encounter: Payer: Self-pay | Admitting: *Deleted

## 2018-01-21 ENCOUNTER — Ambulatory Visit: Payer: Medicare Other | Admitting: Anesthesiology

## 2018-01-21 ENCOUNTER — Other Ambulatory Visit: Payer: Self-pay

## 2018-01-21 ENCOUNTER — Telehealth: Payer: Self-pay | Admitting: Internal Medicine

## 2018-01-21 ENCOUNTER — Encounter
Admission: RE | Disposition: A | Discharge status: Home / Self Care | Payer: Self-pay | Source: Ambulatory Visit | Attending: Ophthalmology

## 2018-01-21 ENCOUNTER — Ambulatory Visit
Admission: RE | Admit: 2018-01-21 | Discharge: 2018-01-21 | Disposition: A | Discharge status: Home / Self Care | Payer: Medicare Other | Source: Ambulatory Visit | Attending: Ophthalmology | Admitting: Ophthalmology

## 2018-01-21 DIAGNOSIS — K219 Gastro-esophageal reflux disease without esophagitis: Secondary | ICD-10-CM | POA: Diagnosis not present

## 2018-01-21 DIAGNOSIS — J449 Chronic obstructive pulmonary disease, unspecified: Secondary | ICD-10-CM | POA: Insufficient documentation

## 2018-01-21 DIAGNOSIS — H2511 Age-related nuclear cataract, right eye: Secondary | ICD-10-CM | POA: Diagnosis not present

## 2018-01-21 DIAGNOSIS — Z96642 Presence of left artificial hip joint: Secondary | ICD-10-CM | POA: Diagnosis not present

## 2018-01-21 HISTORY — PX: CATARACT EXTRACTION W/PHACO: SHX586

## 2018-01-21 SURGERY — PHACOEMULSIFICATION, CATARACT, WITH IOL INSERTION
Anesthesia: Monitor Anesthesia Care | Site: Eye | Laterality: Right | Wound class: "Clean "

## 2018-01-21 MED ORDER — MOXIFLOXACIN HCL 0.5 % OP SOLN: 1.0000 [drp] | OPHTHALMIC | Status: DC | PRN

## 2018-01-21 MED ORDER — ARMC OPHTHALMIC DILATING DROPS
OPHTHALMIC | Status: AC
  Filled 2018-01-21: qty 0.4

## 2018-01-21 MED ORDER — NA CHONDROIT SULF-NA HYALURON 40-17 MG/ML IO SOLN
INTRAOCULAR | Status: DC | PRN
  Administered 2018-01-21: 1 mL via INTRAOCULAR

## 2018-01-21 MED ORDER — NA CHONDROIT SULF-NA HYALURON 40-17 MG/ML IO SOLN
INTRAOCULAR | Status: AC
  Filled 2018-01-21: qty 1

## 2018-01-21 MED ORDER — FENTANYL CITRATE (PF) 100 MCG/2ML IJ SOLN
INTRAMUSCULAR | Status: DC | PRN
  Administered 2018-01-21: 25 ug via INTRAVENOUS

## 2018-01-21 MED ORDER — FENTANYL CITRATE (PF) 100 MCG/2ML IJ SOLN
INTRAMUSCULAR | Status: AC
  Filled 2018-01-21: qty 2

## 2018-01-21 MED ORDER — ONDANSETRON HCL 4 MG/2ML IJ SOLN: 4.0000 mg | Freq: Once | INTRAMUSCULAR | Status: DC | PRN

## 2018-01-21 MED ORDER — ARMC OPHTHALMIC DILATING DROPS
1.0000 "application " | OPHTHALMIC | Status: AC
  Administered 2018-01-21 (×2): 1 via OPHTHALMIC

## 2018-01-21 MED ORDER — MOXIFLOXACIN HCL 0.5 % OP SOLN
OPHTHALMIC | Status: DC | PRN
  Administered 2018-01-21: .2 mL via OPHTHALMIC

## 2018-01-21 MED ORDER — LIDOCAINE HCL (PF) 4 % IJ SOLN
INTRAMUSCULAR | Status: AC
  Filled 2018-01-21: qty 5

## 2018-01-21 MED ORDER — POVIDONE-IODINE 5 % OP SOLN
OPHTHALMIC | Status: DC | PRN
  Administered 2018-01-21: 1 via OPHTHALMIC

## 2018-01-21 MED ORDER — FENTANYL CITRATE (PF) 100 MCG/2ML IJ SOLN: 25.0000 ug | INTRAMUSCULAR | Status: DC | PRN

## 2018-01-21 MED ORDER — SODIUM CHLORIDE 0.9 % IV SOLN
INTRAVENOUS | Status: DC
  Administered 2018-01-21: 08:00:00 via INTRAVENOUS

## 2018-01-21 MED ORDER — LIDOCAINE HCL (PF) 4 % IJ SOLN
INTRAOCULAR | Status: DC | PRN
  Administered 2018-01-21: 2 mL via OPHTHALMIC

## 2018-01-21 MED ORDER — EPINEPHRINE PF 1 MG/ML IJ SOLN
INTRAMUSCULAR | Status: AC
  Filled 2018-01-21: qty 1

## 2018-01-21 MED ORDER — MOXIFLOXACIN HCL 0.5 % OP SOLN
OPHTHALMIC | Status: AC
  Filled 2018-01-21: qty 3

## 2018-01-21 MED ORDER — ONDANSETRON HCL 4 MG/2ML IJ SOLN
4.0000 mg | Freq: Once | INTRAMUSCULAR | Status: DC | PRN
Start: 2018-01-21 — End: 2018-01-21

## 2018-01-21 MED ORDER — EPINEPHRINE PF 1 MG/ML IJ SOLN
INTRAOCULAR | Status: DC | PRN
  Administered 2018-01-21: 1 mL via OPHTHALMIC

## 2018-01-21 MED ORDER — POVIDONE-IODINE 5 % OP SOLN
OPHTHALMIC | Status: AC
  Filled 2018-01-21: qty 30

## 2018-01-21 MED ORDER — ONDANSETRON HCL 4 MG/2ML IJ SOLN
INTRAMUSCULAR | Status: DC | PRN
  Administered 2018-01-21: 4 mg via INTRAVENOUS

## 2018-01-21 MED ORDER — ONDANSETRON HCL 4 MG/2ML IJ SOLN
INTRAMUSCULAR | Status: AC
  Filled 2018-01-21: qty 2

## 2018-01-21 MED ORDER — CARBACHOL 0.01 % IO SOLN
INTRAOCULAR | Status: DC | PRN
  Administered 2018-01-21: .5 mL via INTRAOCULAR

## 2018-01-21 SURGICAL SUPPLY — 16 items
GLOVE BIO SURGEON STRL SZ8 (GLOVE) ×3 IMPLANT
GLOVE BIOGEL M 6.5 STRL (GLOVE) ×3 IMPLANT
GLOVE SURG LX 8.0 MICRO (GLOVE) ×2
GLOVE SURG LX STRL 8.0 MICRO (GLOVE) ×1 IMPLANT
GOWN STRL REUS W/ TWL LRG LVL3 (GOWN DISPOSABLE) ×2 IMPLANT
GOWN STRL REUS W/TWL LRG LVL3 (GOWN DISPOSABLE) ×4
LABEL CATARACT MEDS ST (LABEL) ×3 IMPLANT
LENS IOL TECNIS ITEC 23.0 (Intraocular Lens) ×2 IMPLANT
PACK CATARACT (MISCELLANEOUS) ×3 IMPLANT
PACK CATARACT BRASINGTON LX (MISCELLANEOUS) ×3 IMPLANT
PACK EYE AFTER SURG (MISCELLANEOUS) ×3 IMPLANT
SOL BSS BAG (MISCELLANEOUS) ×3
SOLUTION BSS BAG (MISCELLANEOUS) ×1 IMPLANT
SYR 5ML LL (SYRINGE) ×3 IMPLANT
WATER STERILE IRR 250ML POUR (IV SOLUTION) ×3 IMPLANT
WIPE NON LINTING 3.25X3.25 (MISCELLANEOUS) ×3 IMPLANT

## 2018-01-21 NOTE — H&P (Signed)
All labs reviewed. Abnormal studies sent to patients PCP when indicated.  Previous H&P reviewed, patient examined, there are NO CHANGES.  Toni Versteeg Porfilio5/28/20198:50 AM

## 2018-01-21 NOTE — Telephone Encounter (Signed)
This was not on my desktop until after 5:20 pm.Please advise.

## 2018-01-21 NOTE — Transfer of Care (Signed)
Immediate Anesthesia Transfer of Care Note  Patient: Toni Henry  Procedure(s) Performed: CATARACT EXTRACTION PHACO AND INTRAOCULAR LENS PLACEMENT ([facility]) (Right Eye)  Patient Location: Short Stay  Anesthesia Type:MAC  Level of Consciousness: awake, alert  and patient cooperative  Airway & Oxygen Therapy: Patient Spontanous Breathing  Post-op Assessment: Report given to RN, Post -op Vital signs reviewed and stable and Patient moving all extremities  Post vital signs: Reviewed and stable  Last Vitals:  Vitals Value Taken Time  BP    Temp    Pulse    Resp    SpO2      Last Pain:  Vitals:   01/21/18 0747  TempSrc: Temporal         Complications: No apparent anesthesia complications

## 2018-01-21 NOTE — Anesthesia Postprocedure Evaluation (Signed)
Anesthesia Post Note  Patient: Toni Henry  Procedure(s) Performed: CATARACT EXTRACTION PHACO AND INTRAOCULAR LENS PLACEMENT ([facility]) (Right Eye)  Patient location during evaluation: Short Stay Anesthesia Type: MAC Level of consciousness: awake and alert, oriented and patient cooperative Pain management: satisfactory to patient Vital Signs Assessment: post-procedure vital signs reviewed and stable Respiratory status: spontaneous breathing and respiratory function stable Cardiovascular status: blood pressure returned to baseline and stable Postop Assessment: no headache, no backache, patient able to bend at knees, no apparent nausea or vomiting, adequate PO intake and able to ambulate Anesthetic complications: no     Last Vitals:  Vitals:   01/21/18 0747  BP: (!) 141/67  Pulse: 70  Resp: 18  Temp: 36.9 C  SpO2: 98%    Last Pain:  Vitals:   01/21/18 0747  TempSrc: Temporal                 Raynold Blankenbaker H Alvena Kiernan

## 2018-01-21 NOTE — Telephone Encounter (Signed)
Requested refill on Spiriva and reorder on Albuterol Inhaler.  Called pt.; advised that there is refills available on the Spiriva Inhaler x 1 year.  Spoke with pt's daughter, Izora Gala.  She stated that the patient has about 6 tablets left, and was told by pharmacy that it is too soon to fill.  Daughter voiced frustration that there are 30 tablets given per RX, and that eventually she runs short, due to the months that there are 31 days.  Advised to contact her mother's pharmacy to further discuss.  Agreed.  Daughter also requested to have Albuterol Inhaler filled.  Noted this is not an active medication on the pt's Medication record.  Reported that she saw Dr. Silvio Pate last June, and was advised to use the Albuterol Inhaler for shortness of breath, and to let him know when she needed a refill.  Advised will send note to Dr. Silvio Pate.  Agreed with plan.

## 2018-01-21 NOTE — Anesthesia Procedure Notes (Signed)
Date/Time: 01/21/2018 8:55 AM Performed by: Doreen Salvage, CRNA Pre-anesthesia Checklist: Patient identified, Emergency Drugs available, Suction available and Patient being monitored Patient Re-evaluated:Patient Re-evaluated prior to induction Oxygen Delivery Method: Nasal cannula Comments: Nasal cannula with etCO2 monitoring

## 2018-01-21 NOTE — Discharge Instructions (Addendum)
FOLLOW DR. PORFILIO'S POSTOP DISCHARGE INSTRUCTION SHEET AS REVIEWED.  Eye Surgery Discharge Instructions  Expect mild scratchy sensation or mild soreness. DO NOT RUB YOUR EYE!  The day of surgery:  Minimal physical activity, but bed rest is not required  No reading, computer work, or close hand work  No bending, lifting, or straining.  May watch TV  For 24 hours:  No driving, legal decisions, or alcoholic beverages  Safety precautions  Eat anything you prefer: It is better to start with liquids, then soup then solid foods.  Solar shield eyeglasses should be worn for comfort in the sunlight/patch while sleeping  Resume all regular medications including aspirin or Coumadin if these were discontinued prior to surgery. You may shower, bathe, shave, or wash your hair. Tylenol may be taken for mild discomfort.  Call your doctor if you experience significant pain, nausea, or vomiting, fever > 101 or other signs of infection. [phone] or [phone] Specific instructions:  Follow-up Information    Birder Robson, MD Follow up.   Specialty:  Ophthalmology Why:  01/22/18 @ 10:50 am Contact information: [place] [address] [phone]

## 2018-01-21 NOTE — Telephone Encounter (Signed)
Copied from [place] [id number]. Topic: Quick Communication - Rx Refill/Question >> Jan 21, 2018 11:08 AM Aurelio Brash B wrote: Medication: Has the patient contacSPIRIVA HANDIHALER 18 MCG inhalation capsule  Pt also wants refill  On Albuterol solfate  That is not on med list  ted their pharmacy? (Agent: If no, request that the patient contact the pharmacy for the refill.)yes  (Agent: If yes, when and what did the pharmacy advise?)  Preferred Pharmacy (with phone number or street name): [id number] [address], [place] [phone] (Phone) [phone] (Fax)      Agent: Please be advised that RX refills may take up to 3 business days. We ask that you follow-up with your pharmacy.

## 2018-01-21 NOTE — Op Note (Signed)
PREOPERATIVE DIAGNOSIS:  Nuclear sclerotic cataract of the right eye.   POSTOPERATIVE DIAGNOSIS:  nuclear sclerotic cataract right eye   OPERATIVE PROCEDURE: Procedure(s): CATARACT EXTRACTION PHACO AND INTRAOCULAR LENS PLACEMENT ([facility])   SURGEON:  Birder Robson, MD.   ANESTHESIA:  Anesthesiologist: Gunnar Fusi, MD CRNA: Doreen Salvage, CRNA; Nile Riggs, CRNA  1.      Managed anesthesia care. 2.      0.22ml of Shugarcaine was instilled in the eye following the paracentesis.   COMPLICATIONS:  None.   TECHNIQUE:   Stop and chop   DESCRIPTION OF PROCEDURE:  The patient was examined and consented in the preoperative holding area where the aforementioned topical anesthesia was applied to the right eye and then brought back to the Operating Room where the right eye was prepped and draped in the usual sterile ophthalmic fashion and a lid speculum was placed. A paracentesis was created with the side port blade and the anterior chamber was filled with viscoelastic. A near clear corneal incision was performed with the steel keratome. A continuous curvilinear capsulorrhexis was performed with a cystotome followed by the capsulorrhexis forceps. Hydrodissection and hydrodelineation were carried out with BSS on a blunt cannula. The lens was removed in a stop and chop  technique and the remaining cortical material was removed with the irrigation-aspiration handpiece. The capsular bag was inflated with viscoelastic and the Technis ZCB00  lens was placed in the capsular bag without complication. The remaining viscoelastic was removed from the eye with the irrigation-aspiration handpiece. The wounds were hydrated. The anterior chamber was flushed with Miostat and the eye was inflated to physiologic pressure. 0.52ml of Vigamox was placed in the anterior chamber. The wounds were found to be water tight. The eye was dressed with Vigamox. The patient was given protective glasses to wear throughout the  day and a shield with which to sleep tonight. The patient was also given drops with which to begin a drop regimen today and will follow-up with me in one day. Implant Name Type Inv. Item Serial No. Manufacturer Lot No. LRB No. Used  LENS IOL DIOP 23.0 - [id number] Intraocular Lens LENS IOL DIOP 23.0 [id number] AMO  Right 1   Procedure(s) with comments: CATARACT EXTRACTION PHACO AND INTRAOCULAR LENS PLACEMENT ([facility]) (Right) - [place] 01:08 AP% 23.6 CDE 10.79 Fluid pack lot # [id number]  Electronically signed: Birder Robson 01/21/2018 9:20 AM

## 2018-01-21 NOTE — Anesthesia Preprocedure Evaluation (Signed)
Anesthesia Evaluation  Patient identified by MRN, date of birth, ID band Patient awake    Reviewed: Allergy & Precautions, NPO status , Patient's Chart, lab work & pertinent test results  History of Anesthesia Complications (+) history of anesthetic complications (dry mouth)  Airway Mallampati: III       Dental   Pulmonary neg sleep apnea, COPD,  COPD inhaler,           Cardiovascular (-) hypertension(-) Past MI and (-) CHF (-) dysrhythmias (-) Valvular Problems/Murmurs     Neuro/Psych neg Seizures Dementia    GI/Hepatic Neg liver ROS, neg GERD  ,  Endo/Other  neg diabetes  Renal/GU negative Renal ROS     Musculoskeletal   Abdominal   Peds  Hematology   Anesthesia Other Findings   Reproductive/Obstetrics                             Anesthesia Physical Anesthesia Plan  ASA: III  Anesthesia Plan: MAC   Post-op Pain Management:    Induction:   PONV Risk Score and Plan:   Airway Management Planned: Nasal Cannula  Additional Equipment:   Intra-op Plan:   Post-operative Plan:   Informed Consent: I have reviewed the patients History and Physical, chart, labs and discussed the procedure including the risks, benefits and alternatives for the proposed anesthesia with the patient or authorized representative who has indicated his/her understanding and acceptance.     Plan Discussed with:   Anesthesia Plan Comments:         Anesthesia Quick Evaluation

## 2018-01-21 NOTE — Anesthesia Post-op Follow-up Note (Signed)
Anesthesia QCDR form completed.        

## 2018-01-22 MED ORDER — ALBUTEROL SULFATE HFA 108 (90 BASE) MCG/ACT IN AERS: 2.0000 | INHALATION_SPRAY | Freq: Four times a day (QID) | RESPIRATORY_TRACT | 3 refills | Status: DC | PRN

## 2018-01-22 NOTE — Telephone Encounter (Signed)
Okay to refill the albuterol inhaler #1 x 3

## 2018-01-22 NOTE — Telephone Encounter (Signed)
Albuterol done

## 2018-01-22 NOTE — Addendum Note (Signed)
Addended by: Pilar Grammes on: 01/22/2018 08:57 AM   Modules accepted: Orders

## 2018-02-14 ENCOUNTER — Encounter: Payer: Self-pay | Admitting: Internal Medicine

## 2018-02-14 ENCOUNTER — Ambulatory Visit (INDEPENDENT_AMBULATORY_CARE_PROVIDER_SITE_OTHER): Payer: Medicare Other | Admitting: Internal Medicine

## 2018-02-14 VITALS — BP 118/72 | HR 55 | Temp 97.8°F | Resp 30 | Ht <= 58 in | Wt 84.0 lb

## 2018-02-14 DIAGNOSIS — K219 Gastro-esophageal reflux disease without esophagitis: Secondary | ICD-10-CM

## 2018-02-14 DIAGNOSIS — F39 Unspecified mood [affective] disorder: Secondary | ICD-10-CM | POA: Diagnosis not present

## 2018-02-14 DIAGNOSIS — E440 Moderate protein-calorie malnutrition: Secondary | ICD-10-CM | POA: Diagnosis not present

## 2018-02-14 DIAGNOSIS — Z0000 Encounter for general adult medical examination without abnormal findings: Secondary | ICD-10-CM | POA: Diagnosis not present

## 2018-02-14 DIAGNOSIS — J439 Emphysema, unspecified: Secondary | ICD-10-CM | POA: Diagnosis not present

## 2018-02-14 DIAGNOSIS — Z7189 Other specified counseling: Secondary | ICD-10-CM

## 2018-02-14 DIAGNOSIS — Z23 Encounter for immunization: Secondary | ICD-10-CM

## 2018-02-14 LAB — COMPREHENSIVE METABOLIC PANEL
ALT: 21 U/L (ref 0–35)
AST: 25 U/L (ref 0–37)
Albumin: 4.2 g/dL (ref 3.5–5.2)
Alkaline Phosphatase: 67 U/L (ref 39–117)
BUN: 23 mg/dL (ref 6–23)
CHLORIDE: 101 meq/L (ref 96–112)
CO2: 29 mEq/L (ref 19–32)
Calcium: 9.6 mg/dL (ref 8.4–10.5)
Creatinine, Ser: 0.98 mg/dL (ref 0.40–1.20)
GFR: 55.97 mL/min — ABNORMAL LOW (ref >60.00)
GLUCOSE: 89 mg/dL (ref 70–99)
POTASSIUM: 4.7 meq/L (ref 3.5–5.1)
Sodium: 135 mEq/L (ref 135–145)
Total Bilirubin: 0.6 mg/dL (ref 0.2–1.2)
Total Protein: 7 g/dL (ref 6.0–8.3)

## 2018-02-14 LAB — CBC
HCT: 40 % (ref 36.0–46.0)
HEMOGLOBIN: 13.9 g/dL (ref 12.0–15.0)
MCHC: 34.6 g/dL (ref 30.0–36.0)
MCV: 94.9 fl (ref 78.0–100.0)
Platelets: 184 10*3/uL (ref 150.0–400.0)
RBC: 4.22 Mil/uL (ref 3.87–5.11)
RDW: 13.1 % (ref 11.5–15.5)
WBC: 4 10*3/uL (ref 4.0–10.5)

## 2018-02-14 LAB — SEDIMENTATION RATE: SED RATE: 5 mm/h (ref 0–30)

## 2018-02-14 LAB — T4, FREE: Free T4: 0.97 ng/dL (ref 0.60–1.60)

## 2018-02-14 MED ORDER — MIRTAZAPINE 15 MG PO TABS: 15.0000 mg | ORAL_TABLET | Freq: Every day | ORAL | 11 refills | Status: DC

## 2018-02-14 NOTE — Assessment & Plan Note (Signed)
Has DNR 

## 2018-02-14 NOTE — Progress Notes (Signed)
Hearing Screening Comments: Wears hearing aids Vision Screening Comments: May 2019

## 2018-02-14 NOTE — Assessment & Plan Note (Signed)
Demonstrated proper use of spiriva Hopefully will be better if gains weight and takes this regularly

## 2018-02-14 NOTE — Addendum Note (Signed)
Addended by: Pilar Grammes on: 02/14/2018 12:58 PM   Modules accepted: Orders

## 2018-02-14 NOTE — Assessment & Plan Note (Signed)
Some depression with overall decline Will try the mirtazapine Would probably be better in [place]--but she still resists

## 2018-02-14 NOTE — Assessment & Plan Note (Signed)
Will start mirtazapine Daily meals from [employer]--daughter will arrange Boost daily Consider [place]

## 2018-02-14 NOTE — Progress Notes (Signed)
Subjective:    Patient ID: Toni Henry, female    DOB: 17-Dec-1919, 84 y.o.   MRN: [id number]  HPI Here with daughter for Medicare wellness visit and follow up of chronic health conditions Reviewed form and advanced directives Reviewed other doctors Does have a new hearing aide for right ear Vision is okay---just had cataracts done in both eyes. Will need new glasses Occasional wine No tobacco No exercise now No falls since last year. Does trip over walker at times. Somewhat unsteady on feet Some mood issues Doesn't drive, has aide to do some tasks, monthly housekeeping Hard to bathe --gets her tired Ongoing memory problems  She is having ongoing SOB This seems somewhat worse over the past few months Having trouble doing the spiriva Using the albuterol more--this does help Some cough--not regularly  Concerned about her weight loss Not able to keep up her walking speed Hasn't been ordering meals Goes in [place] to do her shopping and does it all herself Has boost but not taking regularly  Memory seems to be worsening still Mostly alone--does have AM aide for 15-20 minutes Neighbor visits  Daughter feels she is somewhat depressed Wistful thinking---like wishes she was younger or more active Has thought about dying--no suicidal thoughts though Discussed the isolation   Current Outpatient Medications on File Prior to Visit  Medication Sig Dispense Refill  . albuterol (PROAIR HFA) 108 (90 Base) MCG/ACT inhaler Inhale 2 puffs into the lungs every 6 (six) hours as needed. 1 Inhaler 3  . Multiple Vitamin (MULTIVITAMIN) capsule Take 1 capsule by mouth daily.    Marland Kitchen SPIRIVA HANDIHALER 18 MCG inhalation capsule USE ONCE DAILY 30 capsule 11   No current facility-administered medications on file prior to visit.     No Known Allergies  Past Medical History:  Diagnosis Date  . Allergic rhinitis due to pollen   . Cancer ([place])    SKIN  . Dry mouth    has been having since 2017    . Dyspnea   . Edema    FEET/ LEGS  . GERD (gastroesophageal reflux disease)   . HOH (hard of hearing)    AIDS  . Macular degeneration   . Memory changes    EARLY ISSUES WITH SHORT TERM MEMORY LOSS  . Osteoporosis   . Wheezing    OCCAS    Past Surgical History:  Procedure Laterality Date  . ABDOMINAL HYSTERECTOMY    . CATARACT EXTRACTION W/PHACO Left 10/30/2017   Procedure: CATARACT EXTRACTION PHACO AND INTRAOCULAR LENS PLACEMENT (IOC);  Surgeon: Birder Robson, MD;  Location: [facility] ORS;  Service: Ophthalmology;  Laterality: Left;  [place] 01:12.4 AP% 13.9 CDE 10.02 Fluid Pack Lot # [id number]  . CATARACT EXTRACTION W/PHACO Right 01/21/2018   Procedure: CATARACT EXTRACTION PHACO AND INTRAOCULAR LENS PLACEMENT ([place]);  Surgeon: Birder Robson, MD;  Location: [facility] ORS;  Service: Ophthalmology;  Laterality: Right;  [place] 01:08 AP% 23.6 CDE 10.79 Fluid pack lot # [id number]  . HIP FRACTURE SURGERY Left 1/14  . JOINT REPLACEMENT     THR  . TOTAL HIP ARTHROPLASTY Right ~2010    No family history on file.  Social History   Socioeconomic History  . Marital status: Widowed    Spouse name: Not on file  . Number of children: 2  . Years of education: Not on file  . Highest education level: Not on file  Occupational History  . 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 Emergency planning/management officer  strain: Not on file  . Food insecurity:    Worry: Not on file    Inability: Not on file  . Transportation needs:    Medical: Not on file    Non-medical: Not on file  Tobacco Use  . Smoking status: Never Smoker  . Smokeless tobacco: Never Used  Substance and Sexual Activity  . Alcohol use: Yes  . Drug use: No  . Sexual activity: Not on file  Lifestyle  . Physical activity:    Days per week: Not on file    Minutes per session: Not on file  . Stress: Not on file  Relationships  . Social connections:    Talks on phone: Not on file    Gets together: Not on file    Attends  religious service: Not on file    Active member of club or organization: Not on file    Attends meetings of clubs or organizations: Not on file    Relationship status: Not on file  . Intimate partner violence:    Fear of current or ex partner: Not on file    Emotionally abused: Not on file    Physically abused: Not on file    Forced sexual activity: Not on file  Other Topics Concern  . Not on file  Social History Narrative   Widowed ~2012   2 daughters      Has living will   Designated SIL Sricharan Lacomb as health care POA   Requests DNR--form done 11/12/12   Would not accept feeding tube   Review of Systems  Sleeps okay Some tooth problems---chronic dry mouth. Low grade skin cancer on leg---sees dermatologist Bowels slow. No blood No heartburn but has bad feeling in throat after eating. Burps a lot Wears seat belt No sig back or joint pain    Objective:   Physical Exam  Constitutional: She is oriented to person, place, and time. No distress.  Neck: No thyromegaly present.  Cardiovascular: Normal rate, regular rhythm and intact distal pulses. Exam reveals no gallop.  No murmur heard. Respiratory: She has no wheezes. She has no rales.  Decreased breath sounds but clear  GI: Soft. There is no tenderness.  Musculoskeletal: She exhibits no edema.  Lymphadenopathy:    She has no cervical adenopathy.  Neurological: She is alert and oriented to person, place, and time.  President--- "Trump, that nut---Obama, ?" [id number] Recall 3/3  Skin: No rash noted.  Psychiatric: She has a normal mood and affect. Her behavior is normal.           Assessment & Plan:

## 2018-02-14 NOTE — Assessment & Plan Note (Signed)
No clear heartburn but would restart meds if she has ongoing swallowing issues (vague at this point)

## 2018-02-14 NOTE — Assessment & Plan Note (Signed)
I have personally reviewed the Medicare Annual Wellness questionnaire and have noted 1. The patient's medical and social history 2. Their use of alcohol, tobacco or illicit drugs 3. Their current medications and supplements 4. The patient's functional ability including ADL's, fall risks, home safety risks and hearing or visual             impairment. 5. Diet and physical activities 6. Evidence for depression or mood disorders  The patients weight, height, BMI and visual acuity have been recorded in the chart I have made referrals, counseling and provided education to the patient based review of the above and I have provided the pt with a written personalized care plan for preventive services.  I have provided you with a copy of your personalized plan for preventive services. Please take the time to review along with your updated medication list.  Will update pneumovax Yearly flu vaccine May want to consider AL--- I recommend this No cancer screening due to age

## 2018-03-02 ENCOUNTER — Emergency Department
Admission: EM | Admit: 2018-03-02 | Discharge: 2018-03-02 | Disposition: A | Discharge status: Home / Self Care | Payer: Medicare Other | Attending: Emergency Medicine | Admitting: Emergency Medicine

## 2018-03-02 ENCOUNTER — Encounter: Payer: Self-pay | Admitting: Medical Oncology

## 2018-03-02 ENCOUNTER — Emergency Department: Payer: Medicare Other

## 2018-03-02 DIAGNOSIS — R0602 Shortness of breath: Secondary | ICD-10-CM | POA: Diagnosis not present

## 2018-03-02 DIAGNOSIS — Z96642 Presence of left artificial hip joint: Secondary | ICD-10-CM | POA: Diagnosis not present

## 2018-03-02 DIAGNOSIS — J441 Chronic obstructive pulmonary disease with (acute) exacerbation: Secondary | ICD-10-CM

## 2018-03-02 DIAGNOSIS — Z85820 Personal history of malignant melanoma of skin: Secondary | ICD-10-CM | POA: Insufficient documentation

## 2018-03-02 DIAGNOSIS — R05 Cough: Secondary | ICD-10-CM | POA: Diagnosis not present

## 2018-03-02 LAB — CBC WITH DIFFERENTIAL/PLATELET
Basophils Absolute: 0 10*3/uL (ref 0–0.1)
Basophils Relative: 1 %
EOS ABS: 0.1 10*3/uL (ref 0–0.7)
EOS PCT: 2 %
HCT: 40.4 % (ref 35.0–47.0)
Hemoglobin: 14.1 g/dL (ref 12.0–16.0)
LYMPHS ABS: 0.6 10*3/uL — AB (ref 1.0–3.6)
Lymphocytes Relative: 12 %
MCH: 32.9 pg (ref 26.0–34.0)
MCHC: 34.9 g/dL (ref 32.0–36.0)
MCV: 94.3 fL (ref 80.0–100.0)
MONO ABS: 0.4 10*3/uL (ref 0.2–0.9)
MONOS PCT: 7 %
Neutro Abs: 4 10*3/uL (ref 1.4–6.5)
Neutrophils Relative %: 78 %
PLATELETS: 159 10*3/uL (ref 150–440)
RBC: 4.29 MIL/uL (ref 3.80–5.20)
RDW: 13.2 % (ref 11.5–14.5)
WBC: 5.1 10*3/uL (ref 3.6–11.0)

## 2018-03-02 LAB — BASIC METABOLIC PANEL
Anion gap: 6 (ref 5–15)
BUN: 21 mg/dL (ref 8–23)
CALCIUM: 8.8 mg/dL — AB (ref 8.9–10.3)
CO2: 27 mmol/L (ref 22–32)
CREATININE: 0.84 mg/dL (ref 0.44–1.00)
Chloride: 104 mmol/L (ref 98–111)
GFR calc Af Amer: >60 mL/min (ref >60)
GFR, EST NON AFRICAN AMERICAN: 57 mL/min — AB (ref >60)
Glucose, Bld: 103 mg/dL — ABNORMAL HIGH (ref 70–99)
Potassium: 4.2 mmol/L (ref 3.5–5.1)
SODIUM: 137 mmol/L (ref 135–145)

## 2018-03-02 LAB — TROPONIN I: Troponin I: <0.03 ng/mL (ref <0.03)

## 2018-03-02 LAB — BRAIN NATRIURETIC PEPTIDE: B NATRIURETIC PEPTIDE 5: 85 pg/mL (ref 0.0–100.0)

## 2018-03-02 MED ORDER — LORAZEPAM 2 MG/ML IJ SOLN
INTRAMUSCULAR | Status: AC
  Filled 2018-03-02: qty 1

## 2018-03-02 MED ORDER — AZITHROMYCIN 250 MG PO TABS: ORAL_TABLET | ORAL | 0 refills | Status: DC

## 2018-03-02 MED ORDER — IPRATROPIUM-ALBUTEROL 0.5-2.5 (3) MG/3ML IN SOLN
3.0000 mL | Freq: Once | RESPIRATORY_TRACT | Status: AC
  Administered 2018-03-02: 3 mL via RESPIRATORY_TRACT
  Filled 2018-03-02: qty 3

## 2018-03-02 MED ORDER — LORAZEPAM 2 MG/ML IJ SOLN
0.5000 mg | Freq: Once | INTRAMUSCULAR | Status: AC
  Administered 2018-03-02: 0.5 mg via INTRAVENOUS

## 2018-03-02 MED ORDER — PREDNISONE 50 MG PO TABS: ORAL_TABLET | ORAL | 0 refills | Status: DC

## 2018-03-02 NOTE — ED Notes (Signed)
Pt c/o SOB "for a long time" when asked to clarify pt states " I dont know".. Pt is in NAD on arrival. Denies any SOB at present, respirations WNL. Skin is warm dry..the patient is from [facility].

## 2018-03-02 NOTE — ED Triage Notes (Signed)
Pt to ED via EMS from [facility] with reports of sob/cough that began this am, pt reports she has been having these episodes off and on for a while. Denies pain. 1 Albuterol neb given in route.

## 2018-03-02 NOTE — ED Provider Notes (Signed)
[facility] Emergency Department Provider Note       Time seen: ----------------------------------------- 10:28 AM on 03/02/2018 -----------------------------------------   I have reviewed the triage vital signs and the nursing notes.  HISTORY   Chief Complaint Shortness of Breath and Cough    HPI Toni Henry is a 84 y.o. female with a history of edema, GERD, memory loss, wheezing who presents to the ED for shortness of breath and cough.  Patient arrives by EMS from [facility] with shortness of breath and cough that started this morning.  She has had these episodes on and off for "a while".  She received albuterol nebulizer treatment to arrival with some improvement in her symptoms.  She denies any recent illness or other complaints.  Past Medical History:  Diagnosis Date  . Allergic rhinitis due to pollen   . Cancer ([place])    SKIN  . Dry mouth    has been having since 2017  . Dyspnea   . Edema    FEET/ LEGS  . GERD (gastroesophageal reflux disease)   . HOH (hard of hearing)    AIDS  . Macular degeneration   . Memory changes    EARLY ISSUES WITH SHORT TERM MEMORY LOSS  . Osteoporosis   . Wheezing    OCCAS    Patient Active Problem List   Diagnosis Date Noted  . Mood disorder ([facility]oni Henry) 02/14/2018  . Left flank pain 09/20/2017  . Dementia 09/20/2017  . Right shoulder pain 02/25/2017  . Urine frequency 02/25/2017  . Constipation 02/21/2017  . Preventative health care 02/07/2016  . Advance directive discussed with patient 02/07/2016  . COPD (chronic obstructive pulmonary disease) with emphysema (Toni Henry) 01/19/2016  . Dry mouth 02/03/2014  . Malnutrition of moderate degree (Toni Henry) 02/03/2014  . Cervical osteoarthritis 05/28/2013  . Sleep disturbance 11/12/2012  . Other osteoporosis   . GERD (gastroesophageal reflux disease)   . Allergic rhinitis due to pollen   . Macular degeneration     Past Surgical History:  Procedure Laterality Date  .  ABDOMINAL HYSTERECTOMY    . CATARACT EXTRACTION W/PHACO Left 10/30/2017   Procedure: CATARACT EXTRACTION PHACO AND INTRAOCULAR LENS PLACEMENT (IOC);  Surgeon: Birder Robson, MD;  Location: [facility] ORS;  Service: Ophthalmology;  Laterality: Left;  [place] 01:12.4 AP% 13.9 CDE 10.02 Fluid Pack Lot # [id number]  . CATARACT EXTRACTION W/PHACO Right 01/21/2018   Procedure: CATARACT EXTRACTION PHACO AND INTRAOCULAR LENS PLACEMENT ([place]);  Surgeon: Birder Robson, MD;  Location: [facility] ORS;  Service: Ophthalmology;  Laterality: Right;  [place] 01:08 AP% 23.6 CDE 10.79 Fluid pack lot # [id number]  . HIP FRACTURE SURGERY Left 1/14  . JOINT REPLACEMENT     THR  . TOTAL HIP ARTHROPLASTY Right ~2010    Allergies Patient has no known allergies.  Social History Social History   Tobacco Use  . Smoking status: Never Smoker  . Smokeless tobacco: Never Used  Substance Use Topics  . Alcohol use: Yes  . Drug use: No   Review of Systems Constitutional: Negative for fever. Cardiovascular: Negative for chest pain. Respiratory: Positive for shortness of breath, chronic cough Gastrointestinal: Negative for abdominal pain, vomiting and diarrhea. Musculoskeletal: Negative for back pain. Skin: Negative for rash. Neurological: Negative for headaches, focal weakness or numbness.  All systems negative/normal/unremarkable except as stated in the HPI  ____________________________________________   PHYSICAL EXAM:  VITAL SIGNS: ED Triage Vitals  Enc Vitals Group     BP 03/02/18 1022 139/73  Pulse Rate 03/02/18 1022 68     Resp 03/02/18 1022 20     Temp 03/02/18 1024 98 F (36.7 C)     Temp Source 03/02/18 1024 Oral     SpO2 03/02/18 1020 94 %     Weight 03/02/18 1023 84 lb (38.1 kg)     Height 03/02/18 1023 4\' 7"  (1.397 m)     Head Circumference --      Peak Flow --      Pain Score 03/02/18 1023 0     Pain Loc --      Pain Edu? --      Excl. in [place]? --    Constitutional: Alert and oriented. Well  appearing and in no distress. Eyes: Conjunctivae are normal. Normal extraocular movements. ENT   Head: Normocephalic and atraumatic.   Nose: No congestion/rhinnorhea.   Mouth/Throat: Mucous membranes are moist.   Neck: No stridor. Cardiovascular: Normal rate, regular rhythm. No murmurs, rubs, or gallops. Respiratory: Scattered rales and rhonchi, mild tachypnea Gastrointestinal: Soft and nontender. Normal bowel sounds Musculoskeletal: Nontender with normal range of motion in extremities. No lower extremity tenderness nor edema. Neurologic:  Normal speech and language. No gross focal neurologic deficits are appreciated.  Skin:  Skin is warm, dry and intact. No rash noted. Psychiatric: Mood and affect are normal. Speech and behavior are normal.  ____________________________________________  EKG: Interpreted by me.  Sinus rhythm the rate of 68 bpm, left axis deviation, normal QT  ____________________________________________  ED COURSE:  As part of my medical decision making, I reviewed the following data within the [facility] History obtained from family if available, nursing notes, old chart and ekg, as well as notes from prior ED visits. Patient presented for shortness of breath and cough, we will assess with labs and imaging as indicated at this time.   Procedures ____________________________________________   LABS (pertinent positives/negatives)  Labs Reviewed  CBC WITH DIFFERENTIAL/PLATELET - Abnormal; Notable for the following components:      Result Value   Lymphs Abs 0.6 (*)    All other components within normal limits  BASIC METABOLIC PANEL - Abnormal; Notable for the following components:   Glucose, Bld 103 (*)    Calcium 8.8 (*)    GFR calc non Af Amer 57 (*)    All other components within normal limits  BRAIN NATRIURETIC PEPTIDE  TROPONIN I    RADIOLOGY Images were viewed by me  Chest x-ray IMPRESSION: No edema or consolidation.  Stable cardiac silhouette. There is aortic atherosclerosis. Bones osteoporotic with multiple thoracic and lumbar compression fractures.  Aortic Atherosclerosis (ICD10-I70.0). ____________________________________________  DIFFERENTIAL DIAGNOSIS   Pneumonia, COPD, CHF, pneumothorax  FINAL ASSESSMENT AND PLAN  COPD exacerbation   Plan: The patient had presented for shortness of breath and cough. Patient's labs are grossly unremarkable. Patient's imaging reveal any acute process.  She was given steroids as well as breathing treatments here and currently appears improved.  She also required a dose of Ativan.  She is cleared for outpatient follow-up with her doctor.   Laurence Aly, MD   Note: This note was generated in part or whole with voice recognition software. Voice recognition is usually quite accurate but there are transcription errors that can and very often do occur. I apologize for any typographical errors that were not detected and corrected.     Earleen Newport, MD 03/02/18 1213

## 2018-03-03 ENCOUNTER — Telehealth: Payer: Self-pay

## 2018-03-03 NOTE — Telephone Encounter (Signed)
I will plan on that then

## 2018-03-03 NOTE — Telephone Encounter (Signed)
Spoke to pt. She said about 1130 on Thursday would be fine.

## 2018-03-03 NOTE — Telephone Encounter (Signed)
Spoke to pt. She is feeling a little better. She suggested I call her daughter, Jana Half. She is away for the next 2 weeks. Asking if she could be seen at [facility]?

## 2018-03-03 NOTE — Telephone Encounter (Signed)
I could do a home visit on Thursday if she doesn't think she can get here

## 2018-03-06 ENCOUNTER — Encounter: Payer: Self-pay | Admitting: Internal Medicine

## 2018-03-06 ENCOUNTER — Ambulatory Visit: Payer: Medicare Other | Admitting: Internal Medicine

## 2018-03-06 DIAGNOSIS — J441 Chronic obstructive pulmonary disease with (acute) exacerbation: Secondary | ICD-10-CM | POA: Diagnosis not present

## 2018-03-06 DIAGNOSIS — F0390 Unspecified dementia without behavioral disturbance: Secondary | ICD-10-CM

## 2018-03-06 DIAGNOSIS — E440 Moderate protein-calorie malnutrition: Secondary | ICD-10-CM | POA: Diagnosis not present

## 2018-03-06 NOTE — Assessment & Plan Note (Signed)
Resisting meal delivery Discussed calorie dense foods like cheese nabs and ice cream Urged daughter to work with her on regular snacks, etc

## 2018-03-06 NOTE — Progress Notes (Signed)
Subjective:    Patient ID: Toni Henry, female    DOB: October 04, 1919, 84 y.o.   MRN: [id number]  HPI Home visit for follow up of ER visit Was having trouble breathing Taken by EMS to [facility] Already feeling better with the neb Rx in the ambulance She doesn't remember a lot of this  No fever Chronic cough--not much worse Has been using the spiriva  Still has the albuterol ---uses daily. I don't think she knew to take it extra with her SOB  Feels okay now Gets some meals from [place] also bring food She cooks---but not much  Done with z-pak Thinks she took some prednisone but not sure how much  Current Outpatient Medications on File Prior to Visit  Medication Sig Dispense Refill  . albuterol (PROAIR HFA) 108 (90 Base) MCG/ACT inhaler Inhale 2 puffs into the lungs every 6 (six) hours as needed. 1 Inhaler 3  . mirtazapine (REMERON) 15 MG tablet Take 1 tablet (15 mg total) by mouth at bedtime. 30 tablet 11  . Multiple Vitamin (MULTIVITAMIN) capsule Take 1 capsule by mouth daily.    Marland Kitchen SPIRIVA HANDIHALER 18 MCG inhalation capsule USE ONCE DAILY 30 capsule 11   No current facility-administered medications on file prior to visit.     No Known Allergies  Past Medical History:  Diagnosis Date  . Allergic rhinitis due to pollen   . Cancer ([place])    SKIN  . Dry mouth    has been having since 2017  . Dyspnea   . Edema    FEET/ LEGS  . GERD (gastroesophageal reflux disease)   . HOH (hard of hearing)    AIDS  . Macular degeneration   . Memory changes    EARLY ISSUES WITH SHORT TERM MEMORY LOSS  . Osteoporosis   . Wheezing    OCCAS    Past Surgical History:  Procedure Laterality Date  . ABDOMINAL HYSTERECTOMY    . CATARACT EXTRACTION W/PHACO Left 10/30/2017   Procedure: CATARACT EXTRACTION PHACO AND INTRAOCULAR LENS PLACEMENT (IOC);  Surgeon: Birder Robson, MD;  Location: [facility] ORS;  Service: Ophthalmology;  Laterality: Left;  [place] 01:12.4 AP% 13.9 CDE  10.02 Fluid Pack Lot # [id number]  . CATARACT EXTRACTION W/PHACO Right 01/21/2018   Procedure: CATARACT EXTRACTION PHACO AND INTRAOCULAR LENS PLACEMENT ([place]);  Surgeon: Birder Robson, MD;  Location: [facility] ORS;  Service: Ophthalmology;  Laterality: Right;  [place] 01:08 AP% 23.6 CDE 10.79 Fluid pack lot # [id number]  . HIP FRACTURE SURGERY Left 1/14  . JOINT REPLACEMENT     THR  . TOTAL HIP ARTHROPLASTY Right ~2010    No family history on file.  Social History   Socioeconomic History  . Marital status: Widowed    Spouse name: Not on file  . Number of children: 2  . Years of education: Not on file  . Highest education level: Not on file  Occupational History  . 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 Financial resource strain: Not on file  . Food insecurity:    Worry: Not on file    Inability: Not on file  . Transportation needs:    Medical: Not on file    Non-medical: Not on file  Tobacco Use  . Smoking status: Never Smoker  . Smokeless tobacco: Never Used  Substance and Sexual Activity  . Alcohol use: Yes  . Drug use: No  . Sexual activity: Not on file  Lifestyle  .  Physical activity:    Days per week: Not on file    Minutes per session: Not on file  . Stress: Not on file  Relationships  . Social connections:    Talks on phone: Not on file    Gets together: Not on file    Attends religious service: Not on file    Active member of club or organization: Not on file    Attends meetings of clubs or organizations: Not on file    Relationship status: Not on file  . Intimate partner violence:    Fear of current or ex partner: Not on file    Emotionally abused: Not on file    Physically abused: Not on file    Forced sexual activity: Not on file  Other Topics Concern  . Not on file  Social History Narrative   Widowed ~2012   2 daughters      Has living will   Designated SIL Richard as health care POA   Requests DNR--form done 11/12/12   Would  not accept feeding tube   Review of Systems Sleeping okay Bowels are variable---uses something for constipation at times No trouble voiding    Objective:   Physical Exam  Constitutional: No distress.  Clear muscle wasting  Cardiovascular: Normal rate, regular rhythm and normal heart sounds. Exam reveals no gallop.  No murmur heard. Respiratory:  Decreased breath sounds but clear  GI: Soft. There is no tenderness.  Musculoskeletal: She exhibits no edema.  Neurological:  Forgetful but appropriate interaction  Psychiatric:  Upset about my advice----eat more, get more help, et           Assessment & Plan:

## 2018-03-06 NOTE — Assessment & Plan Note (Signed)
Needs more supervision PC with Toni Henry her daughter Will consider adding evening aides Resistant to this and considering AL

## 2018-03-06 NOTE — Assessment & Plan Note (Signed)
Mild and seems better now Not clear infection but got z-pak Course of prednisone---not sure how much she took Would have low threshold to start daily prednisone---but can hold off now

## 2018-03-24 ENCOUNTER — Ambulatory Visit: Payer: Medicare Other | Admitting: Internal Medicine

## 2018-03-28 ENCOUNTER — Encounter

## 2018-03-28 ENCOUNTER — Ambulatory Visit: Payer: Medicare Other | Admitting: Internal Medicine

## 2018-04-17 ENCOUNTER — Telehealth: Payer: Self-pay

## 2018-04-17 NOTE — Telephone Encounter (Signed)
I spoke with pt; pt said she was not dizzy earlier, pt just felt tired due to not resting the last 2 nights. Pt is not sure why she could not sleep well the last 2 nights but pt has been drinking the gatorade and said she feels better now. Pt said she will let Dr Silvio Pate know if she has any more problems.FYI to Dr Silvio Pate.

## 2018-04-17 NOTE — Telephone Encounter (Signed)
Copied from [place] [id number]. Topic: Quick Communication - See Telephone Encounter >> Apr 17, 2018  9:45 AM Antonieta Iba C wrote: CRM for notification. See Telephone encounter for: 04/17/18.  Called in to make provider aware that, Pt was getting dressed today to go to the grocery store and started to feel very dizzy as the driver was picking her up. They did check pt's vitals and she was dehydrated.  They have encouraged pt to drink Gatorade. She said that pt told them that she has not been getting any sleep.    CBWells Guiles RN w/ [facility] - [phone]

## 2018-04-17 NOTE — Telephone Encounter (Signed)
I would not have seen her since she is in independent living Someone should follow up and check on her later today

## 2018-04-17 NOTE — Telephone Encounter (Signed)
I left v/m for Jerolyn Center RN at [facility] that Dr Silvio Pate was at [facility] this morning if she needed to contact him about pt. Will send note to Dr Silvio Pate as well.

## 2018-04-17 NOTE — Telephone Encounter (Signed)
That sounds fine

## 2018-06-02 ENCOUNTER — Telehealth: Payer: Self-pay | Admitting: Internal Medicine

## 2018-06-02 MED ORDER — ALBUTEROL SULFATE HFA 108 (90 BASE) MCG/ACT IN AERS: 2.0000 | INHALATION_SPRAY | Freq: Four times a day (QID) | RESPIRATORY_TRACT | 3 refills | Status: DC | PRN

## 2018-06-02 NOTE — Telephone Encounter (Signed)
Copied from [place] [id number]. Topic: Quick Communication - Rx Refill/Question >> Jun 02, 2018 11:13 AM Percell Belt A wrote: Medication: albuterol [facility] HFA) 108 (90 Base) MCG/ACT inhaler [[id number]  Has the patient contacted their pharmacy? No  (Agent: If no, request that the patient contact the pharmacy for the refill.) (Agent: If yes, when and what did the pharmacy advise?)  Preferred Pharmacy (with phone number or street name): [id number] [address], [place] [phone] (Phone)   Agent: Please be advised that RX refills may take up to 3 business days. We ask that you follow-up with your pharmacy.

## 2018-06-02 NOTE — Telephone Encounter (Signed)
I spoke with pt; pt was not having any problems with breathing she had just ran out of med; home visit on 03/06/18. Refill done per protocol and pt voiced understanding that refill would be sent to [facility].

## 2018-06-12 DIAGNOSIS — Z23 Encounter for immunization: Secondary | ICD-10-CM | POA: Diagnosis not present

## 2018-06-18 DIAGNOSIS — Z85828 Personal history of other malignant neoplasm of skin: Secondary | ICD-10-CM | POA: Diagnosis not present

## 2018-06-18 DIAGNOSIS — L820 Inflamed seborrheic keratosis: Secondary | ICD-10-CM | POA: Diagnosis not present

## 2018-06-18 DIAGNOSIS — L578 Other skin changes due to chronic exposure to nonionizing radiation: Secondary | ICD-10-CM | POA: Diagnosis not present

## 2018-06-18 DIAGNOSIS — L821 Other seborrheic keratosis: Secondary | ICD-10-CM | POA: Diagnosis not present

## 2018-06-18 DIAGNOSIS — D692 Other nonthrombocytopenic purpura: Secondary | ICD-10-CM | POA: Diagnosis not present

## 2018-06-21 ENCOUNTER — Emergency Department
Admission: EM | Admit: 2018-06-21 | Discharge: 2018-06-21 | Disposition: A | Discharge status: Home / Self Care | Payer: Medicare Other | Attending: Emergency Medicine | Admitting: Emergency Medicine

## 2018-06-21 ENCOUNTER — Encounter: Payer: Self-pay | Admitting: Emergency Medicine

## 2018-06-21 ENCOUNTER — Emergency Department: Payer: Medicare Other

## 2018-06-21 DIAGNOSIS — Z5321 Procedure and treatment not carried out due to patient leaving prior to being seen by health care provider: Secondary | ICD-10-CM | POA: Insufficient documentation

## 2018-06-21 DIAGNOSIS — R0602 Shortness of breath: Secondary | ICD-10-CM | POA: Insufficient documentation

## 2018-06-21 LAB — CBC
HEMATOCRIT: 38.8 % (ref 36.0–46.0)
Hemoglobin: 13 g/dL (ref 12.0–15.0)
MCH: 32.3 pg (ref 26.0–34.0)
MCHC: 33.5 g/dL (ref 30.0–36.0)
MCV: 96.5 fL (ref 80.0–100.0)
Platelets: 164 10*3/uL (ref 150–400)
RBC: 4.02 MIL/uL (ref 3.87–5.11)
RDW: 12.4 % (ref 11.5–15.5)
WBC: 6.1 10*3/uL (ref 4.0–10.5)
nRBC: 0 % (ref 0.0–0.2)

## 2018-06-21 LAB — BASIC METABOLIC PANEL
Anion gap: 6 (ref 5–15)
BUN: 32 mg/dL — ABNORMAL HIGH (ref 8–23)
CALCIUM: 9.1 mg/dL (ref 8.9–10.3)
CO2: 29 mmol/L (ref 22–32)
Chloride: 104 mmol/L (ref 98–111)
Creatinine, Ser: 1.04 mg/dL — ABNORMAL HIGH (ref 0.44–1.00)
GFR calc Af Amer: 51 mL/min — ABNORMAL LOW (ref >60)
GFR calc non Af Amer: 44 mL/min — ABNORMAL LOW (ref >60)
GLUCOSE: 130 mg/dL — AB (ref 70–99)
Potassium: 4.6 mmol/L (ref 3.5–5.1)
Sodium: 139 mmol/L (ref 135–145)

## 2018-06-21 LAB — TROPONIN I: Troponin I: <0.03 ng/mL (ref <0.03)

## 2018-06-21 NOTE — ED Triage Notes (Signed)
Patient states that she has chronic shortness of breath that has become worse today. Patient states that she has an inhaler that helps with her breathing but is afraid to use it because she is worried she will run out of her inhaler. Lungs sounds clear at this time.

## 2018-07-09 ENCOUNTER — Telehealth: Payer: Self-pay | Admitting: Internal Medicine

## 2018-07-09 DIAGNOSIS — J441 Chronic obstructive pulmonary disease with (acute) exacerbation: Secondary | ICD-10-CM

## 2018-07-09 MED ORDER — ALBUTEROL SULFATE (2.5 MG/3ML) 0.083% IN NEBU: 2.5000 mg | INHALATION_SOLUTION | Freq: Four times a day (QID) | RESPIRATORY_TRACT | 5 refills | Status: DC | PRN

## 2018-07-09 NOTE — Telephone Encounter (Signed)
Spoke to patient and her daughter at her house I doubt she is getting much when she uses the inhaler Will try switching to nebulizer  Will have to set up follow up home visit---she can't get here easily (actually, I can go tomorrow)   [place], Please order her a nebulizer (check with Rosaria Ferries about this) Needs albuterol 2.5gm in 3cc saline---- 4 times per day prn (#120 x 5)

## 2018-07-09 NOTE — Telephone Encounter (Signed)
I called and spoke to [facility] to let her know the nebulizer was ready for pickup at the [facility] location. She said she had already left the ares to go back home and was not sure when she would be back up. I gave her Corene Cornea Pierce's info to call and ask if it can be delivered. She asked who would show her mom how to use it and I advised her I could not answer that question. If it could be delivered before Dr Silvio Pate went to see her tomorrow, he could possibly show her. She is not sure how the [facility] will [facility].

## 2018-07-09 NOTE — Telephone Encounter (Signed)
Spoke to [place]. She said she filled the inhaler on 06-26-18 #200 puffs and she has #48 left. The pharmacy will not allow an early refill and Izora Gala is up helping  Get her meds together. She said the pt is having more issues with her breathing and is using the albuterol more often. Asked if there was something else she should be using daily instead of the albuterol.

## 2018-07-09 NOTE — Addendum Note (Signed)
Addended by: Pilar Grammes on: 07/09/2018 02:38 PM   Modules accepted: Orders

## 2018-07-09 NOTE — Telephone Encounter (Signed)
I have created an order for a Nebulizer and sent a request to Darlina Guys at [facility] to see if they can help the pt get it the machine and I have [facility] in [facility]

## 2018-07-09 NOTE — Telephone Encounter (Signed)
Pt's daughter called office stating her mother is having to use the Albuterol inhaler more frequently. The pt is almost out and is requesting to get a refill sooner.

## 2018-07-10 ENCOUNTER — Telehealth: Payer: Self-pay

## 2018-07-10 ENCOUNTER — Encounter: Payer: Self-pay | Admitting: Internal Medicine

## 2018-07-10 ENCOUNTER — Ambulatory Visit: Payer: Medicare Other | Admitting: Internal Medicine

## 2018-07-10 VITALS — BP 122/64 | HR 64 | Resp 24

## 2018-07-10 DIAGNOSIS — F39 Unspecified mood [affective] disorder: Secondary | ICD-10-CM

## 2018-07-10 DIAGNOSIS — F0390 Unspecified dementia without behavioral disturbance: Secondary | ICD-10-CM | POA: Diagnosis not present

## 2018-07-10 DIAGNOSIS — E440 Moderate protein-calorie malnutrition: Secondary | ICD-10-CM | POA: Diagnosis not present

## 2018-07-10 DIAGNOSIS — J439 Emphysema, unspecified: Secondary | ICD-10-CM | POA: Diagnosis not present

## 2018-07-10 NOTE — Assessment & Plan Note (Signed)
Mild memory issues Mostly okay with function but can't remember meds properly Daughters will try to remind her, etc [place] and is stable for now

## 2018-07-10 NOTE — Telephone Encounter (Signed)
Toni Henry (DPR signed) said that the albuterol neb solution is not filled yet due to needing more information from Dr Alla German office; [facility] so can process filling the rx.Toni Henry request to be done ASAP and  cb when done.

## 2018-07-10 NOTE — Assessment & Plan Note (Signed)
Has intermittent depression Hopefully, if they can get her to remember the mirtazapine, that will help

## 2018-07-10 NOTE — Assessment & Plan Note (Signed)
Weight seems stable Getting meals delivered Hopefully the mirtazapine can help this

## 2018-07-10 NOTE — Progress Notes (Signed)
Subjective:    Patient ID: Toni Henry, female    DOB: 11/08/1919, 84 y.o.   MRN: [id number]  HPI Home visit in her [place] to be homebound--hasn't been out in a month or so Phone call with daughter Inez Catalina?Izora Gala on speaker phone  Her breathing "comes and goes" Using the albuterol inhaler too much We ordered nebulizer yesterday  No cough or fever  Can't do too much--does have help with cleaning Has aide most days---briefly (cream on her back) Food is delivered from the [place] and friends get her  She still doesn't want to go to assisted living  Did have the mirtazapine and took it for about 2 weeks She had trouble remembering to take it and has just left it off  She feels her memory is about the same Daughters feel it is about the same---better some days and worse on others  They note some depression---but not every day Seems to be more often lately though  Current Outpatient Medications on File Prior to Visit  Medication Sig Dispense Refill  . albuterol (PROAIR HFA) 108 (90 Base) MCG/ACT inhaler Inhale 2 puffs into the lungs every 6 (six) hours as needed. 1 Inhaler 3  . albuterol (PROVENTIL) (2.5 MG/3ML) 0.083% nebulizer solution Take 3 mLs (2.5 mg total) by nebulization 4 (four) times daily as needed for wheezing or shortness of breath. 120 vial 5  . mirtazapine (REMERON) 15 MG tablet Take 1 tablet (15 mg total) by mouth at bedtime. 30 tablet 11  . Multiple Vitamin (MULTIVITAMIN) capsule Take 1 capsule by mouth daily.    Marland Kitchen SPIRIVA HANDIHALER 18 MCG inhalation capsule USE ONCE DAILY 30 capsule 11   No current facility-administered medications on file prior to visit.     No Known Allergies  Past Medical History:  Diagnosis Date  . Allergic rhinitis due to pollen   . Cancer ([place])    SKIN  . Dry mouth    has been having since 2017  . Dyspnea   . Edema    FEET/ LEGS  . GERD (gastroesophageal reflux disease)   . HOH (hard of hearing)    AIDS  . Macular  degeneration   . Memory changes    EARLY ISSUES WITH SHORT TERM MEMORY LOSS  . Osteoporosis   . Wheezing    OCCAS    Past Surgical History:  Procedure Laterality Date  . ABDOMINAL HYSTERECTOMY    . CATARACT EXTRACTION W/PHACO Left 10/30/2017   Procedure: CATARACT EXTRACTION PHACO AND INTRAOCULAR LENS PLACEMENT (IOC);  Surgeon: Birder Robson, MD;  Location: [facility] ORS;  Service: Ophthalmology;  Laterality: Left;  [place] 01:12.4 AP% 13.9 CDE 10.02 Fluid Pack Lot # [id number]  . CATARACT EXTRACTION W/PHACO Right 01/21/2018   Procedure: CATARACT EXTRACTION PHACO AND INTRAOCULAR LENS PLACEMENT ([place]);  Surgeon: Birder Robson, MD;  Location: [facility] ORS;  Service: Ophthalmology;  Laterality: Right;  [place] 01:08 AP% 23.6 CDE 10.79 Fluid pack lot # [id number]  . HIP FRACTURE SURGERY Left 1/14  . JOINT REPLACEMENT     THR  . TOTAL HIP ARTHROPLASTY Right ~2010    No family history on file.  Social History   Socioeconomic History  . Marital status: Widowed    Spouse name: Not on file  . Number of children: 2  . Years of education: Not on file  . Highest education level: Not on file  Occupational History  . 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 Financial resource strain:  Not on file  . Food insecurity:    Worry: Not on file    Inability: Not on file  . Transportation needs:    Medical: Not on file    Non-medical: Not on file  Tobacco Use  . Smoking status: Never Smoker  . Smokeless tobacco: Never Used  Substance and Sexual Activity  . Alcohol use: Yes  . Drug use: No  . Sexual activity: Not on file  Lifestyle  . Physical activity:    Days per week: Not on file    Minutes per session: Not on file  . Stress: Not on file  Relationships  . Social connections:    Talks on phone: Not on file    Gets together: Not on file    Attends religious service: Not on file    Active member of club or organization: Not on file    Attends meetings of clubs or  organizations: Not on file    Relationship status: Not on file  . Intimate partner violence:    Fear of current or ex partner: Not on file    Emotionally abused: Not on file    Physically abused: Not on file    Forced sexual activity: Not on file  Other Topics Concern  . Not on file  Social History Narrative   Widowed ~2012   2 daughters      Has living will   Designated SIL Richard as health care POA   Requests DNR--form done 11/12/12   Would not accept feeding tube   Review of Systems Appetite still spotty Doesn't weight herself---but appears about the same Sleeps fairly well--but not always Bowels are slow at times Some soreness on her buttocks--better since she has put a cushion on it Had cryotherapy on the back of both calves recently---spot on right calf opened and bled last night     Objective:   Physical Exam  Constitutional: No distress.  Still with clear wasting  Neck: No thyromegaly present.  Cardiovascular: Normal rate, regular rhythm and normal heart sounds.  No murmur heard. Respiratory: Effort normal. No respiratory distress. She has no rales.  Decreased breath sounds  Minimal wheeze--not tight  GI: Soft. There is no tenderness.  Musculoskeletal: She exhibits no edema.  Lymphadenopathy:    She has no cervical adenopathy.  Skin:  Excoriations on calves----from derm treatment recently  Psychiatric: She has a normal mood and affect. Her behavior is normal.           Assessment & Plan:

## 2018-07-10 NOTE — Assessment & Plan Note (Signed)
Mild persistent symptoms Unclear if she is using the spiriva regularly---daughters will try to remind her Nebulizer for albuterol ordered---nurses here can help her learn to use properly (I don't think she got much from the albuterol inhaler)

## 2018-07-10 NOTE — Telephone Encounter (Signed)
I just received the form this afternoon. I have completed it and faxed it back to [facility].

## 2018-07-11 ENCOUNTER — Other Ambulatory Visit: Payer: Self-pay | Admitting: Internal Medicine

## 2018-07-11 NOTE — Telephone Encounter (Signed)
Toni Henry (DPR signed) called as I was typing this note and Toni Henry request cb when the medication is approved and can be picked up.

## 2018-07-11 NOTE — Telephone Encounter (Signed)
Spoke to [place] and advised her that I sent in the rx with the dx code for part b to cover.

## 2018-07-11 NOTE — Telephone Encounter (Signed)
There are two team health messages re: pre authorization for albuterol. Copy of TH notes taken to [facility] CMA and copy sent for scanning.

## 2018-07-11 NOTE — Telephone Encounter (Signed)
Dx code provided so pharmacy can process under Part B instead of Part D

## 2018-07-14 ENCOUNTER — Telehealth: Payer: Self-pay | Admitting: *Deleted

## 2018-07-14 MED ORDER — SPACER/AERO-HOLDING CHAMBERS DEVI: 1.0000 | Freq: Once | 0 refills | Status: AC

## 2018-07-14 MED ORDER — ALBUTEROL SULFATE HFA 108 (90 BASE) MCG/ACT IN AERS: 2.0000 | INHALATION_SPRAY | RESPIRATORY_TRACT | 5 refills | Status: DC | PRN

## 2018-07-14 NOTE — Telephone Encounter (Signed)
Spoke to [place]. Home health nurse came over today to teach her how use the nebulizer and how to clean it. It overwhelmed the pt so she is not real comfortable right now with it. They are continuing to work with her to get her comfortable. She is out of her albuterol inhaler and insurance is not wanting to fill it because it is early. Toni Henry is asking can we change the directions on the albuterol inhaler or should she try something else along with her Spiriva to help her breathing? She said the Spiriva only lasts about half a day.

## 2018-07-14 NOTE — Telephone Encounter (Signed)
We can change the albuterol to 2 puffs every 4 hours prn (#1 x 5) They need to dispense with a spacer The spiriva is just once a day----- I would not add anything more because I am not sure she is taking any of these correctly

## 2018-07-14 NOTE — Telephone Encounter (Signed)
Spoke to [place]. Advised her what we were doing. She will make sure someone teaches her how to use the spacer.

## 2018-07-14 NOTE — Telephone Encounter (Signed)
Patient's daughter called stating that patient has a nebulizer machine that she is having a hard time figuring out on how to use the machine. Izora Gala stated that patient is completely out of her albuterol inhaler and is in a panic to get a refill on that.

## 2018-07-21 ENCOUNTER — Ambulatory Visit (INDEPENDENT_AMBULATORY_CARE_PROVIDER_SITE_OTHER): Payer: Medicare Other | Admitting: Internal Medicine

## 2018-07-21 ENCOUNTER — Telehealth: Payer: Self-pay

## 2018-07-21 ENCOUNTER — Encounter: Payer: Self-pay | Admitting: Internal Medicine

## 2018-07-21 ENCOUNTER — Ambulatory Visit: Payer: Medicare Other | Admitting: Family Medicine

## 2018-07-21 VITALS — BP 136/80 | HR 74 | Temp 97.5°F | Resp 30 | Ht <= 58 in | Wt 83.0 lb

## 2018-07-21 DIAGNOSIS — F39 Unspecified mood [affective] disorder: Secondary | ICD-10-CM

## 2018-07-21 DIAGNOSIS — F0390 Unspecified dementia without behavioral disturbance: Secondary | ICD-10-CM | POA: Diagnosis not present

## 2018-07-21 DIAGNOSIS — J441 Chronic obstructive pulmonary disease with (acute) exacerbation: Secondary | ICD-10-CM | POA: Diagnosis not present

## 2018-07-21 MED ORDER — METHYLPREDNISOLONE ACETATE 80 MG/ML IJ SUSP
80.0000 mg | Freq: Once | INTRAMUSCULAR | Status: AC
  Administered 2018-07-21: 80 mg via INTRAMUSCULAR

## 2018-07-21 NOTE — Telephone Encounter (Signed)
Okay to send Rx for lorazepam 0.25-0.5 tid prn for anxiety or SOB #30 x 0 She should not be using the nebulizer more than 4 times a day (or the inhaler more than every 4 hours)

## 2018-07-21 NOTE — Telephone Encounter (Signed)
Spoke to [place]. She said her Mom is using the spacer but she thinks she is not actually breathing in to inhale the medication. She seems to be holding her breath. She said her Mom cannot cut the tablets in half. I told her I would ask if a 0.25mg  tab would be okay and make direction 1-2 tid prn anxiety or SOB.

## 2018-07-21 NOTE — Telephone Encounter (Signed)
They don't make a 0.25 I am very reluctant to prescribe anything like that as it will increase her risk of falling. Find out if she intends to have her go to health care or not----sounds like she is just not able to be by herself anymore

## 2018-07-21 NOTE — Assessment & Plan Note (Signed)
Now clearly exacerbated Cannot figure out how to take medications correctly so will give depomedrol IM Afraid to use benzos for anxiety since she can't take it correctly and high fall risk Daughter will try to set up home aides to make sure nebulizer is set up and prompt her to use it--I recommend twice a day If worsens, should go to ER or health care for Rx

## 2018-07-21 NOTE — Addendum Note (Signed)
Addended by: Pilar Grammes on: 07/21/2018 04:33 PM   Modules accepted: Orders

## 2018-07-21 NOTE — Telephone Encounter (Signed)
Pts daughter Izora Gala is requesting a call back directly from [facility] regarding pts Rx

## 2018-07-21 NOTE — Telephone Encounter (Signed)
Toni Henry (DPR signed) pt got the albuterol on 07/16/18 and has used the med completely up. Toni Henry thinks that pt gets anxious about having trouble breathing. Toni Henry thinks pt needs med for anxiety. Toni Henry request cb. Toni Henry asked if Dr Silvio Pate was at [facility] this morning and I advised Toni Henry could call [facility] and speak with independent living nurse. Toni Henry will call [facility]. [facility]. Pt last home visit on 07/10/18.

## 2018-07-21 NOTE — Telephone Encounter (Signed)
Left message to call the office for daughter, Izora Gala.  If she calls back and I am not available, please relay the message from Dr Silvio Pate.

## 2018-07-21 NOTE — Assessment & Plan Note (Signed)
Cannot use any medications correctly  Will not prescribe oral meds for her without supervision AL apparently not an option and she doesn't want health care---daughter will work on more help

## 2018-07-21 NOTE — Progress Notes (Signed)
Subjective:    Patient ID: Toni Henry, female    DOB: [birth year], 84 y.o.   MRN: [id number]  HPI Here for follow up with trouble breathing and issues at her [place] at [facility] See also extensive emails from the independent living nurse there Daughter Izora Gala on speaker phone  Feels SOB frequently Has used up a full inhaler in just a week or so--but not using it correctly No fever Doesn't feel that she is sick  Some anxiety ---she is vague about it (?only with SOB) Feels "not relaxed"   Current Outpatient Medications on File Prior to Visit  Medication Sig Dispense Refill  . albuterol (PROAIR HFA) 108 (90 Base) MCG/ACT inhaler Inhale 2 puffs into the lungs every 4 (four) hours as needed. 1 Inhaler 5  . albuterol (PROVENTIL) (2.5 MG/3ML) 0.083% nebulizer solution Take 3 mLs (2.5 mg total) by nebulization every 6 (six) hours as needed for wheezing or shortness of breath. Dx Code J43.9 120 vial 5  . mirtazapine (REMERON) 15 MG tablet Take 1 tablet (15 mg total) by mouth at bedtime. 30 tablet 11  . Multiple Vitamin (MULTIVITAMIN) capsule Take 1 capsule by mouth daily.    Marland Kitchen SPIRIVA HANDIHALER 18 MCG inhalation capsule USE ONCE DAILY 30 capsule 11   No current facility-administered medications on file prior to visit.     No Known Allergies  Past Medical History:  Diagnosis Date  . Allergic rhinitis due to pollen   . Cancer ([place])    SKIN  . Dry mouth    has been having since 2017  . Dyspnea   . Edema    FEET/ LEGS  . GERD (gastroesophageal reflux disease)   . HOH (hard of hearing)    AIDS  . Macular degeneration   . Memory changes    EARLY ISSUES WITH SHORT TERM MEMORY LOSS  . Osteoporosis   . Wheezing    OCCAS    Past Surgical History:  Procedure Laterality Date  . ABDOMINAL HYSTERECTOMY    . CATARACT EXTRACTION W/PHACO Left 10/30/2017   Procedure: CATARACT EXTRACTION PHACO AND INTRAOCULAR LENS PLACEMENT (IOC);  Surgeon: Birder Robson, MD;  Location: [facility] ORS;   Service: Ophthalmology;  Laterality: Left;  [place] 01:12.4 AP% 13.9 CDE 10.02 Fluid Pack Lot # [id number]  . CATARACT EXTRACTION W/PHACO Right 01/21/2018   Procedure: CATARACT EXTRACTION PHACO AND INTRAOCULAR LENS PLACEMENT ([place]);  Surgeon: Birder Robson, MD;  Location: [facility] ORS;  Service: Ophthalmology;  Laterality: Right;  [place] 01:08 AP% 23.6 CDE 10.79 Fluid pack lot # [id number]  . HIP FRACTURE SURGERY Left 1/14  . JOINT REPLACEMENT     THR  . TOTAL HIP ARTHROPLASTY Right ~2010    History reviewed. No pertinent family history.  Social History   Socioeconomic History  . Marital status: Widowed    Spouse name: Not on file  . Number of children: 2  . Years of education: Not on file  . Highest education level: Not on file  Occupational History  . 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 Financial resource strain: Not on file  . Food insecurity:    Worry: Not on file    Inability: Not on file  . Transportation needs:    Medical: Not on file    Non-medical: Not on file  Tobacco Use  . Smoking status: Never Smoker  . Smokeless tobacco: Never Used  Substance and Sexual Activity  . Alcohol use: Yes  . Drug use:  No  . Sexual activity: Not on file  Lifestyle  . Physical activity:    Days per week: Not on file    Minutes per session: Not on file  . Stress: Not on file  Relationships  . Social connections:    Talks on phone: Not on file    Gets together: Not on file    Attends religious service: Not on file    Active member of club or organization: Not on file    Attends meetings of clubs or organizations: Not on file    Relationship status: Not on file  . Intimate partner violence:    Fear of current or ex partner: Not on file    Emotionally abused: Not on file    Physically abused: Not on file    Forced sexual activity: Not on file  Other Topics Concern  . Not on file  Social History Narrative   Widowed ~2012   2 daughters      Has living will    Designated SIL Seanna Sisler as health care POA   Requests DNR--form done 11/12/12   Would not accept feeding tube   Review of Systems Eating okay Thinks she sleeps okay    Objective:   Physical Exam  Constitutional:  tachypneic but no clear distress   Respiratory:  Decreased breath sounds with some wheezing Somewhat tight  Neurological:  As I came in, her phone was ringing but she vehemently denied she had a phone. Eventually found it in her pocket  Psychiatric:  Anxious and fearful about all that is going on           Assessment & Plan:

## 2018-07-21 NOTE — Telephone Encounter (Signed)
Spoke to [place]. She said she will not be coming with her to the [place]. Dr Silvio Pate is not wanting to do the lorazepam at this time. He will call her during her OV today.

## 2018-07-21 NOTE — Assessment & Plan Note (Signed)
Anxiety, dysthymia? Will hold off on the mirtazapine--not sure she is taking correctly even with daughter's prompting

## 2018-07-29 DIAGNOSIS — S7001XA Contusion of right hip, initial encounter: Secondary | ICD-10-CM | POA: Diagnosis not present

## 2018-07-29 DIAGNOSIS — Y92018 Other place in single-family (private) house as the place of occurrence of the external cause: Secondary | ICD-10-CM | POA: Diagnosis not present

## 2018-07-29 DIAGNOSIS — S32038A Other fracture of third lumbar vertebra, initial encounter for closed fracture: Secondary | ICD-10-CM | POA: Diagnosis not present

## 2018-07-29 DIAGNOSIS — S32018A Other fracture of first lumbar vertebra, initial encounter for closed fracture: Secondary | ICD-10-CM | POA: Diagnosis not present

## 2018-07-29 DIAGNOSIS — S79911A Unspecified injury of right hip, initial encounter: Secondary | ICD-10-CM | POA: Diagnosis not present

## 2018-07-29 DIAGNOSIS — M25561 Pain in right knee: Secondary | ICD-10-CM | POA: Diagnosis not present

## 2018-07-29 DIAGNOSIS — M545 Low back pain: Secondary | ICD-10-CM | POA: Diagnosis not present

## 2018-07-29 DIAGNOSIS — S32028A Other fracture of second lumbar vertebra, initial encounter for closed fracture: Secondary | ICD-10-CM | POA: Diagnosis not present

## 2018-07-29 DIAGNOSIS — W1839XA Other fall on same level, initial encounter: Secondary | ICD-10-CM | POA: Diagnosis not present

## 2018-07-29 DIAGNOSIS — F0390 Unspecified dementia without behavioral disturbance: Secondary | ICD-10-CM | POA: Diagnosis not present

## 2018-09-12 ENCOUNTER — Encounter: Payer: Self-pay | Admitting: Internal Medicine

## 2018-09-12 ENCOUNTER — Ambulatory Visit (INDEPENDENT_AMBULATORY_CARE_PROVIDER_SITE_OTHER): Payer: Medicare Other | Admitting: Internal Medicine

## 2018-09-12 VITALS — BP 120/74 | HR 84 | Temp 98.1°F | Ht <= 58 in | Wt 84.5 lb

## 2018-09-12 DIAGNOSIS — J439 Emphysema, unspecified: Secondary | ICD-10-CM

## 2018-09-12 DIAGNOSIS — F39 Unspecified mood [affective] disorder: Secondary | ICD-10-CM

## 2018-09-12 DIAGNOSIS — F0390 Unspecified dementia without behavioral disturbance: Secondary | ICD-10-CM

## 2018-09-12 DIAGNOSIS — K2100 Gastro-esophageal reflux disease with esophagitis, without bleeding: Secondary | ICD-10-CM

## 2018-09-12 DIAGNOSIS — E440 Moderate protein-calorie malnutrition: Secondary | ICD-10-CM | POA: Diagnosis not present

## 2018-09-12 MED ORDER — OMEPRAZOLE 20 MG PO CPDR: 20.0000 mg | DELAYED_RELEASE_CAPSULE | Freq: Every day | ORAL | 3 refills | Status: AC

## 2018-09-12 NOTE — Assessment & Plan Note (Signed)
Chronic anxiety --especially with the dyspnea ?dysthymia Will hold off on meds

## 2018-09-12 NOTE — Assessment & Plan Note (Signed)
Worse now with esophageal dysphagia Will start omeprazole at bedtime Recheck with home visit in the next few weeks

## 2018-09-12 NOTE — Assessment & Plan Note (Signed)
Doing better now with regular nebulizer Rx

## 2018-09-12 NOTE — Progress Notes (Signed)
Subjective:    Patient ID: Toni Henry, female    DOB: June 02, 1920, 85 y.o.   MRN: [id number]  HPI Here with [place]--social worker from [facility] Daughter on speaker phone [facility])  Sleeping okay--but when she gets up "I am all clogged up" "Miserable" She doesn't remember but daughter notes she is describing dysphagia Will eat but then need to throw up after --- "it gets stuck" Will be complaining on the phone to daughters but talking at the same time  Daughters call every night--and remind her to take her pill at night  Now having twice a day aides for her 1 hour in AM, afternoon to help with dinner,etc  Breathing seems to be better Does use the nebulizer regularly  Clearly has some anxiety Will call daughter--and be hyperventilating Tends to settle down over time  Current Outpatient Medications on File Prior to Visit  Medication Sig Dispense Refill  . albuterol (PROVENTIL) (2.5 MG/3ML) 0.083% nebulizer solution Take 3 mLs (2.5 mg total) by nebulization every 6 (six) hours as needed for wheezing or shortness of breath. Dx Code J43.9 120 vial 5  . mirtazapine (REMERON) 15 MG tablet Take 1 tablet (15 mg total) by mouth at bedtime. 30 tablet 11  . Multiple Vitamin (MULTIVITAMIN) capsule Take 1 capsule by mouth daily.    Marland Kitchen SPIRIVA HANDIHALER 18 MCG inhalation capsule USE ONCE DAILY 30 capsule 11   No current facility-administered medications on file prior to visit.     No Known Allergies  Past Medical History:  Diagnosis Date  . Allergic rhinitis due to pollen   . Cancer ([place])    SKIN  . Dry mouth    has been having since 2017  . Dyspnea   . Edema    FEET/ LEGS  . GERD (gastroesophageal reflux disease)   . HOH (hard of hearing)    AIDS  . Macular degeneration   . Memory changes    EARLY ISSUES WITH SHORT TERM MEMORY LOSS  . Osteoporosis   . Wheezing    OCCAS    Past Surgical History:  Procedure Laterality Date  . ABDOMINAL HYSTERECTOMY    . CATARACT  EXTRACTION W/PHACO Left 10/30/2017   Procedure: CATARACT EXTRACTION PHACO AND INTRAOCULAR LENS PLACEMENT (IOC);  Surgeon: Birder Robson, MD;  Location: [facility] ORS;  Service: Ophthalmology;  Laterality: Left;  [place] 01:12.4 AP% 13.9 CDE 10.02 Fluid Pack Lot # [id number]  . CATARACT EXTRACTION W/PHACO Right 01/21/2018   Procedure: CATARACT EXTRACTION PHACO AND INTRAOCULAR LENS PLACEMENT ([place]);  Surgeon: Birder Robson, MD;  Location: [facility] ORS;  Service: Ophthalmology;  Laterality: Right;  [place] 01:08 AP% 23.6 CDE 10.79 Fluid pack lot # [id number]  . HIP FRACTURE SURGERY Left 1/14  . JOINT REPLACEMENT     THR  . TOTAL HIP ARTHROPLASTY Right ~2010    History reviewed. No pertinent family history.  Social History   Socioeconomic History  . Marital status: Widowed    Spouse name: Not on file  . Number of children: 2  . Years of education: Not on file  . Highest education level: Not on file  Occupational History  . 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 Financial resource strain: Not on file  . Food insecurity:    Worry: Not on file    Inability: Not on file  . Transportation needs:    Medical: Not on file    Non-medical: Not on file  Tobacco Use  . Smoking status:  Never Smoker  . Smokeless tobacco: Never Used  Substance and Sexual Activity  . Alcohol use: Yes  . Drug use: No  . Sexual activity: Not on file  Lifestyle  . Physical activity:    Days per week: Not on file    Minutes per session: Not on file  . Stress: Not on file  Relationships  . Social connections:    Talks on phone: Not on file    Gets together: Not on file    Attends religious service: Not on file    Active member of club or organization: Not on file    Attends meetings of clubs or organizations: Not on file    Relationship status: Not on file  . Intimate partner violence:    Fear of current or ex partner: Not on file    Emotionally abused: Not on file    Physically abused: Not on  file    Forced sexual activity: Not on file  Other Topics Concern  . Not on file  Social History Narrative   Widowed ~2012   2 daughters      Has living will   Designated SIL  as health care POA   Requests DNR--form done 11/12/12   Would not accept feeding tube   Review of Systems  Sleeping okay Weight is stable She still calls independent living nurse--or the aide calls. Mostly related to anxiety     Objective:   Physical Exam  HENT:  Mouth/Throat: Oropharynx is clear and moist. No oropharyngeal exudate.  Spits out froth (and states she is vomiting)  Neck: No thyromegaly present.  Cardiovascular: Normal rate, regular rhythm and normal heart sounds. Exam reveals no gallop.  No murmur heard. Respiratory: Effort normal. No respiratory distress. She has no wheezes. She has no rales.  Decreased breath sounds but clear  GI: Soft. There is no abdominal tenderness.  Musculoskeletal:        General: No edema.  Lymphadenopathy:    She has no cervical adenopathy.           Assessment & Plan:

## 2018-09-12 NOTE — Assessment & Plan Note (Signed)
Weight has stabilized on the mirtazapine

## 2018-09-12 NOTE — Assessment & Plan Note (Signed)
Situation has stabilized some with the bid aides Again discussed the [place] would be a better setting

## 2018-09-15 ENCOUNTER — Ambulatory Visit: Payer: Medicare Other | Admitting: Internal Medicine

## 2018-09-15 ENCOUNTER — Encounter: Payer: Self-pay | Admitting: Emergency Medicine

## 2018-09-15 ENCOUNTER — Other Ambulatory Visit: Payer: Self-pay

## 2018-09-15 ENCOUNTER — Inpatient Hospital Stay
Admission: EM | Admit: 2018-09-15 | Discharge: 2018-09-18 | DRG: 391 | Disposition: A | Discharge status: Home Health | Payer: Medicare Other | Attending: Internal Medicine | Admitting: Internal Medicine

## 2018-09-15 DIAGNOSIS — K219 Gastro-esophageal reflux disease without esophagitis: Secondary | ICD-10-CM | POA: Diagnosis present

## 2018-09-15 DIAGNOSIS — Z961 Presence of intraocular lens: Secondary | ICD-10-CM | POA: Diagnosis present

## 2018-09-15 DIAGNOSIS — Z96641 Presence of right artificial hip joint: Secondary | ICD-10-CM | POA: Diagnosis present

## 2018-09-15 DIAGNOSIS — T17320A Food in larynx causing asphyxiation, initial encounter: Secondary | ICD-10-CM | POA: Diagnosis not present

## 2018-09-15 DIAGNOSIS — Z9842 Cataract extraction status, left eye: Secondary | ICD-10-CM

## 2018-09-15 DIAGNOSIS — F0390 Unspecified dementia without behavioral disturbance: Secondary | ICD-10-CM | POA: Diagnosis present

## 2018-09-15 DIAGNOSIS — Z515 Encounter for palliative care: Secondary | ICD-10-CM | POA: Diagnosis not present

## 2018-09-15 DIAGNOSIS — R1310 Dysphagia, unspecified: Secondary | ICD-10-CM

## 2018-09-15 DIAGNOSIS — J301 Allergic rhinitis due to pollen: Secondary | ICD-10-CM | POA: Diagnosis present

## 2018-09-15 DIAGNOSIS — Z7189 Other specified counseling: Secondary | ICD-10-CM | POA: Diagnosis not present

## 2018-09-15 DIAGNOSIS — Z9841 Cataract extraction status, right eye: Secondary | ICD-10-CM

## 2018-09-15 DIAGNOSIS — J9600 Acute respiratory failure, unspecified whether with hypoxia or hypercapnia: Secondary | ICD-10-CM | POA: Diagnosis not present

## 2018-09-15 DIAGNOSIS — H9190 Unspecified hearing loss, unspecified ear: Secondary | ICD-10-CM | POA: Diagnosis present

## 2018-09-15 DIAGNOSIS — R945 Abnormal results of liver function studies: Secondary | ICD-10-CM | POA: Diagnosis present

## 2018-09-15 DIAGNOSIS — Z66 Do not resuscitate: Secondary | ICD-10-CM | POA: Diagnosis present

## 2018-09-15 DIAGNOSIS — Z90710 Acquired absence of both cervix and uterus: Secondary | ICD-10-CM | POA: Diagnosis not present

## 2018-09-15 DIAGNOSIS — T189XXA Foreign body of alimentary tract, part unspecified, initial encounter: Secondary | ICD-10-CM | POA: Diagnosis not present

## 2018-09-15 DIAGNOSIS — Z85828 Personal history of other malignant neoplasm of skin: Secondary | ICD-10-CM

## 2018-09-15 DIAGNOSIS — R7401 Elevation of levels of liver transaminase levels: Secondary | ICD-10-CM

## 2018-09-15 DIAGNOSIS — H3530 Unspecified macular degeneration: Secondary | ICD-10-CM | POA: Diagnosis present

## 2018-09-15 DIAGNOSIS — K220 Achalasia of cardia: Secondary | ICD-10-CM | POA: Diagnosis present

## 2018-09-15 DIAGNOSIS — T18128D Food in esophagus causing other injury, subsequent encounter: Secondary | ICD-10-CM | POA: Diagnosis not present

## 2018-09-15 DIAGNOSIS — E860 Dehydration: Secondary | ICD-10-CM | POA: Diagnosis present

## 2018-09-15 DIAGNOSIS — T18128A Food in esophagus causing other injury, initial encounter: Secondary | ICD-10-CM | POA: Diagnosis present

## 2018-09-15 DIAGNOSIS — R748 Abnormal levels of other serum enzymes: Secondary | ICD-10-CM | POA: Diagnosis present

## 2018-09-15 DIAGNOSIS — T18128S Food in esophagus causing other injury, sequela: Secondary | ICD-10-CM | POA: Diagnosis not present

## 2018-09-15 DIAGNOSIS — T17908A Unspecified foreign body in respiratory tract, part unspecified causing other injury, initial encounter: Secondary | ICD-10-CM | POA: Diagnosis not present

## 2018-09-15 DIAGNOSIS — Z9911 Dependence on respirator [ventilator] status: Secondary | ICD-10-CM | POA: Diagnosis not present

## 2018-09-15 DIAGNOSIS — Z85820 Personal history of malignant melanoma of skin: Secondary | ICD-10-CM | POA: Diagnosis not present

## 2018-09-15 DIAGNOSIS — J449 Chronic obstructive pulmonary disease, unspecified: Secondary | ICD-10-CM | POA: Diagnosis present

## 2018-09-15 DIAGNOSIS — E43 Unspecified severe protein-calorie malnutrition: Secondary | ICD-10-CM | POA: Diagnosis present

## 2018-09-15 DIAGNOSIS — B20 Human immunodeficiency virus [HIV] disease: Secondary | ICD-10-CM | POA: Diagnosis present

## 2018-09-15 DIAGNOSIS — M810 Age-related osteoporosis without current pathological fracture: Secondary | ICD-10-CM | POA: Diagnosis present

## 2018-09-15 DIAGNOSIS — J95821 Acute postprocedural respiratory failure: Secondary | ICD-10-CM | POA: Diagnosis not present

## 2018-09-15 DIAGNOSIS — Z681 Body mass index (BMI) 19 or less, adult: Secondary | ICD-10-CM | POA: Diagnosis not present

## 2018-09-15 DIAGNOSIS — J439 Emphysema, unspecified: Secondary | ICD-10-CM | POA: Diagnosis not present

## 2018-09-15 DIAGNOSIS — J9601 Acute respiratory failure with hypoxia: Secondary | ICD-10-CM | POA: Diagnosis not present

## 2018-09-15 DIAGNOSIS — R740 Nonspecific elevation of levels of transaminase and lactic acid dehydrogenase [LDH]: Secondary | ICD-10-CM

## 2018-09-15 DIAGNOSIS — Z79899 Other long term (current) drug therapy: Secondary | ICD-10-CM

## 2018-09-15 LAB — COMPREHENSIVE METABOLIC PANEL
ALT: 33 U/L (ref 0–44)
AST: 43 U/L — AB (ref 15–41)
Albumin: 4.1 g/dL (ref 3.5–5.0)
Alkaline Phosphatase: 211 U/L — ABNORMAL HIGH (ref 38–126)
Anion gap: 13 (ref 5–15)
BUN: 38 mg/dL — AB (ref 8–23)
CO2: 21 mmol/L — ABNORMAL LOW (ref 22–32)
Calcium: 9 mg/dL (ref 8.9–10.3)
Chloride: 105 mmol/L (ref 98–111)
Creatinine, Ser: 1.1 mg/dL — ABNORMAL HIGH (ref 0.44–1.00)
GFR calc Af Amer: 49 mL/min — ABNORMAL LOW (ref >60)
GFR, EST NON AFRICAN AMERICAN: 42 mL/min — AB (ref >60)
Glucose, Bld: 104 mg/dL — ABNORMAL HIGH (ref 70–99)
Potassium: 4.2 mmol/L (ref 3.5–5.1)
Sodium: 139 mmol/L (ref 135–145)
Total Bilirubin: 1.7 mg/dL — ABNORMAL HIGH (ref 0.3–1.2)
Total Protein: 7.2 g/dL (ref 6.5–8.1)

## 2018-09-15 LAB — URINALYSIS, COMPLETE (UACMP) WITH MICROSCOPIC
Bacteria, UA: NONE SEEN
Bilirubin Urine: NEGATIVE
GLUCOSE, UA: NEGATIVE mg/dL
Hgb urine dipstick: NEGATIVE
Ketones, ur: 20 mg/dL — AB
Leukocytes, UA: NEGATIVE
Nitrite: NEGATIVE
PROTEIN: 30 mg/dL — AB
Specific Gravity, Urine: 1.02 (ref 1.005–1.030)
Squamous Epithelial / HPF: NONE SEEN (ref 0–5)
pH: 5 (ref 5.0–8.0)

## 2018-09-15 LAB — MAGNESIUM: Magnesium: 1.9 mg/dL (ref 1.7–2.4)

## 2018-09-15 LAB — CBC
HCT: 42.9 % (ref 36.0–46.0)
Hemoglobin: 13.9 g/dL (ref 12.0–15.0)
MCH: 32.9 pg (ref 26.0–34.0)
MCHC: 32.4 g/dL (ref 30.0–36.0)
MCV: 101.4 fL — ABNORMAL HIGH (ref 80.0–100.0)
Platelets: 217 10*3/uL (ref 150–400)
RBC: 4.23 MIL/uL (ref 3.87–5.11)
RDW: 13.7 % (ref 11.5–15.5)
WBC: 9.3 10*3/uL (ref 4.0–10.5)
nRBC: 0 % (ref 0.0–0.2)

## 2018-09-15 LAB — LIPASE, BLOOD: Lipase: 28 U/L (ref 11–51)

## 2018-09-15 MED ORDER — ORAL CARE MOUTH RINSE
15.0000 mL | Freq: Two times a day (BID) | OROMUCOSAL | Status: DC
  Administered 2018-09-16: 15 mL via OROMUCOSAL

## 2018-09-15 MED ORDER — MIRTAZAPINE 15 MG PO TABS
15.0000 mg | ORAL_TABLET | Freq: Every day | ORAL | Status: DC
  Administered 2018-09-15: 15 mg via ORAL
  Filled 2018-09-15: qty 1

## 2018-09-15 MED ORDER — ALBUTEROL SULFATE (2.5 MG/3ML) 0.083% IN NEBU
2.5000 mg | INHALATION_SOLUTION | Freq: Once | RESPIRATORY_TRACT | Status: AC
  Administered 2018-09-15: 2.5 mg via RESPIRATORY_TRACT
  Filled 2018-09-15: qty 3

## 2018-09-15 MED ORDER — HYDROCODONE-ACETAMINOPHEN 5-325 MG PO TABS: 1.0000 | ORAL_TABLET | ORAL | Status: DC | PRN

## 2018-09-15 MED ORDER — ONDANSETRON HCL 4 MG PO TABS: 4.0000 mg | ORAL_TABLET | Freq: Four times a day (QID) | ORAL | Status: DC | PRN

## 2018-09-15 MED ORDER — SODIUM CHLORIDE 0.9 % IV BOLUS
1000.0000 mL | Freq: Once | INTRAVENOUS | Status: AC
  Administered 2018-09-15: 1000 mL via INTRAVENOUS

## 2018-09-15 MED ORDER — POTASSIUM CHLORIDE IN NACL 20-0.9 MEQ/L-% IV SOLN
INTRAVENOUS | Status: DC
  Administered 2018-09-15: 16:00:00 via INTRAVENOUS
  Filled 2018-09-15 (×2): qty 1000

## 2018-09-15 MED ORDER — ONDANSETRON HCL 4 MG/2ML IJ SOLN: 4.0000 mg | Freq: Four times a day (QID) | INTRAMUSCULAR | Status: DC | PRN

## 2018-09-15 MED ORDER — ONDANSETRON 4 MG PO TBDP
4.0000 mg | ORAL_TABLET | Freq: Once | ORAL | Status: AC
  Administered 2018-09-15: 4 mg via ORAL
  Filled 2018-09-15: qty 1

## 2018-09-15 MED ORDER — ALBUTEROL SULFATE (2.5 MG/3ML) 0.083% IN NEBU: 2.5000 mg | INHALATION_SOLUTION | RESPIRATORY_TRACT | Status: DC | PRN

## 2018-09-15 MED ORDER — ENOXAPARIN SODIUM 30 MG/0.3ML ~~LOC~~ SOLN
30.0000 mg | SUBCUTANEOUS | Status: DC
  Administered 2018-09-15 – 2018-09-17 (×3): 30 mg via SUBCUTANEOUS
  Filled 2018-09-15 (×3): qty 0.3

## 2018-09-15 MED ORDER — ACETAMINOPHEN 325 MG PO TABS: 650.0000 mg | ORAL_TABLET | Freq: Four times a day (QID) | ORAL | Status: DC | PRN

## 2018-09-15 MED ORDER — SODIUM CHLORIDE 0.9% FLUSH
3.0000 mL | Freq: Once | INTRAVENOUS | Status: AC
  Administered 2018-09-15: 3 mL via INTRAVENOUS

## 2018-09-15 MED ORDER — ACETAMINOPHEN 650 MG RE SUPP: 650.0000 mg | Freq: Four times a day (QID) | RECTAL | Status: DC | PRN

## 2018-09-15 NOTE — ED Notes (Signed)
Daughter remains at bedside. NAD. Pt remains to spit up white saliva. Daughter wants pt to drink, will wait until dr Archie Balboa sees pt. No nausea.

## 2018-09-15 NOTE — Progress Notes (Signed)
Daughter of patient refuses mother to be placed in low bed. Patient and daughter educated on purpose of low beds.

## 2018-09-15 NOTE — Progress Notes (Signed)
Anticoagulation monitoring(Lovenox):  85 yo female ordered Lovenox 40 mg Q24h  Filed Weights   09/15/18 0959  Weight: 84 lb 7 oz (38.3 kg)   BMI 19.3    Lab Results  Component Value Date   CREATININE 1.10 (H) 09/15/2018   CREATININE 1.04 (H) 06/21/2018   CREATININE 0.84 03/02/2018   Estimated Creatinine Clearance: 16.6 mL/min (A) (by C-G formula based on SCr of 1.1 mg/dL (H)). Hemoglobin & Hematocrit     Component Value Date/Time   HGB 13.9 09/15/2018 1010   HGB 7.4 (L) 09/26/2012 0411   HCT 42.9 09/15/2018 1010   HCT 21.2 (L) 09/24/2012 0348     Per Protocol for Patient with estCrcl < 30 ml/min and BMI < 40, will transition to Lovenox 30 mg Q24h.

## 2018-09-15 NOTE — ED Triage Notes (Signed)
First Nurse Note:  Arrives with daughter for ED evaluation for possible dehydration.  Daughter states that patient's GERD has been bothering her and is concerned she may be dehydrated.

## 2018-09-15 NOTE — ED Notes (Signed)
Pt unable to tolerate water. Will spit water up after drinking. Almost seems to have trouble getting down. Dr Archie Balboa aware.

## 2018-09-15 NOTE — ED Notes (Signed)
Pt given water; began to cough and spit it up almost immediately.

## 2018-09-15 NOTE — ED Notes (Signed)
Daughter reports pt normally takes a nebulizer of albuterol at home at this time and would like her to have it as would at home.  Dr Archie Balboa notified.

## 2018-09-15 NOTE — ED Triage Notes (Signed)
Patient's daughter reports for the last week, any time the patient has something to eat or drink, she immediately starts spitting up saliva and mucous. Patient denies any nausea or abdominal pain. Daughter states patient was started on medication for acid reflux on Saturday but she is unsure if patient is keeping medication down. Daughter is concerned for possible dehydration.

## 2018-09-15 NOTE — ED Provider Notes (Signed)
[facility] Emergency Department Provider Note   ____________________________________________   I have reviewed the triage vital signs and the nursing notes.   HISTORY  Chief Complaint Dehydration History limited by: Not Limited   HPI Toni Henry is a 85 y.o. female who presents to the emergency department today accompanied by her daughter because of concerns for dehydration.  Apparently the patient has been having some issues tolerating oral intake over the past roughly week.  Anytime she tries to eat or drink something she wants of just spitting up afterwards.  She did see her doctor for this on Friday and was given a prescription for Prilosec.  Daughter is not sure the patient is been able to keep the Prilosec down.  She is concerned that the patient is getting dehydrated.  Patient denies any abdominal pain.  Denies any nausea.  No fevers.  States she has not had a bowel movement in 4 days but believes this is because she has not taken much in.  Per medical record review patient has a history of recent PCP visit with concern for GERD.  Past Medical History:  Diagnosis Date  . Allergic rhinitis due to pollen   . Cancer (HCC)    SKIN  . Dry mouth    has been having since 2017  . Dyspnea   . Edema    FEET/ LEGS  . GERD (gastroesophageal reflux disease)   . HOH (hard of hearing)    AIDS  . Macular degeneration   . Memory changes    EARLY ISSUES WITH SHORT TERM MEMORY LOSS  . Osteoporosis   . Wheezing    OCCAS    Patient Active Problem List   Diagnosis Date Noted  . Mood disorder (HCC) 02/14/2018  . Dementia (HCC) 09/20/2017  . Right shoulder pain 02/25/2017  . Urine frequency 02/25/2017  . Constipation 02/21/2017  . COPD exacerbation (HCC) 08/13/2016  . Preventative health care 02/07/2016  . Advance directive discussed with patient 02/07/2016  . COPD (chronic obstructive pulmonary disease) with emphysema (HCC) 01/19/2016  . Dry mouth  02/03/2014  . Malnutrition of moderate degree (HCC) 02/03/2014  . Cervical osteoarthritis 05/28/2013  . Sleep disturbance 11/12/2012  . Other osteoporosis   . GERD (gastroesophageal reflux disease)   . Allergic rhinitis due to pollen   . Macular degeneration     Past Surgical History:  Procedure Laterality Date  . ABDOMINAL HYSTERECTOMY    . CATARACT EXTRACTION W/PHACO Left 10/30/2017   Procedure: CATARACT EXTRACTION PHACO AND INTRAOCULAR LENS PLACEMENT (IOC);  Surgeon: Porfilio, William, MD;  Location: [facility] ORS;  Service: Ophthalmology;  Laterality: Left;  US 01:12.4 AP% 13.9 CDE 10.02 Fluid Pack Lot # [id number]  . CATARACT EXTRACTION W/PHACO Right 01/21/2018   Procedure: CATARACT EXTRACTION PHACO AND INTRAOCULAR LENS PLACEMENT (IOC);  Surgeon: Porfilio, William, MD;  Location: [facility] ORS;  Service: Ophthalmology;  Laterality: Right;  US 01:08 AP% 23.6 CDE 10.79 Fluid pack lot # [id number]  . HIP FRACTURE SURGERY Left 1/14  . JOINT REPLACEMENT     THR  . TOTAL HIP ARTHROPLASTY Right ~2010    Prior to Admission medications   Medication Sig Start Date End Date Taking? Authorizing Provider  albuterol (PROVENTIL) (2.5 MG/3ML) 0.083% nebulizer solution Take 3 mLs (2.5 mg total) by nebulization every 6 (six) hours as needed for wheezing or shortness of breath. Dx Code J43.9 07/11/18  Yes Letvak, Richard I, MD  mirtazapine (REMERON) 15 MG tablet Take 1 tablet (15 mg total)   by mouth at bedtime. 02/14/18  Yes Letvak, Richard I, MD  Multiple Vitamin (MULTIVITAMIN) capsule Take 1 capsule by mouth daily.   Yes [provider]  omeprazole (PRILOSEC) 20 MG capsule Take 1 capsule (20 mg total) by mouth daily. 09/12/18  Yes Letvak, Richard I, MD  SPIRIVA HANDIHALER 18 MCG inhalation capsule USE ONCE DAILY Patient not taking: Reported on 09/15/2018 09/16/17   Letvak, Richard I, MD    Allergies Patient has no known allergies.  No family history on file.  Social History Social History    Tobacco Use  . Smoking status: Never Smoker  . Smokeless tobacco: Never Used  Substance Use Topics  . Alcohol use: Yes  . Drug use: No    Review of Systems Constitutional: No fever/chills Eyes: No visual changes. ENT: No sore throat. Cardiovascular: Denies chest pain. Respiratory: Denies shortness of breath. Gastrointestinal: No abdominal pain. Positive for decreased ability to tolerate PO. Genitourinary: Negative for dysuria. Musculoskeletal: Negative for back pain. Skin: Negative for rash. Neurological: Negative for headaches, focal weakness or numbness.  ____________________________________________   PHYSICAL EXAM:  VITAL SIGNS: ED Triage Vitals [09/15/18 0959]  Enc Vitals Group     BP (!) 142/82     Pulse Rate 78     Resp 18     Temp 98.5 F (36.9 C)     Temp Source Oral     SpO2 98 %     Weight 84 lb 7 oz (38.3 kg)     Height 4' 7.5" (1.41 m)     Head Circumference      Peak Flow      Pain Score 0   Constitutional: Alert and oriented.  Eyes: Conjunctivae are normal.  ENT      Head: Normocephalic and atraumatic.      Nose: No congestion/rhinnorhea.      Mouth/Throat: Mucous membranes are moist.      Neck: No stridor. Hematological/Lymphatic/Immunilogical: No cervical lymphadenopathy. Cardiovascular: Normal rate, regular rhythm.  No murmurs, rubs, or gallops.  Respiratory: Normal respiratory effort without tachypnea nor retractions. Breath sounds are clear and equal bilaterally. No wheezes/rales/rhonchi. Gastrointestinal: Soft and non tender. No rebound. No guarding.  Genitourinary: Deferred Musculoskeletal: Normal range of motion in all extremities. No lower extremity edema. Neurologic:  Normal speech and language. No gross focal neurologic deficits are appreciated.  Skin:  Skin is warm, dry and intact. No rash noted. Psychiatric: Mood and affect are normal. Speech and behavior are normal. Patient exhibits appropriate insight and  judgment.  ____________________________________________    LABS (pertinent positives/negatives)  Lipase 28 CBC wbc 9.3, hgb 13.9, plt 217 CMP na 139, k 4.2, glu 104, alk phos 211, t bili 1.7  ____________________________________________   EKG  None  ____________________________________________    RADIOLOGY  None  ____________________________________________   PROCEDURES  Procedures  ____________________________________________   INITIAL IMPRESSION / ASSESSMENT AND PLAN / ED COURSE  Pertinent labs & imaging results that were available during my care of the patient were reviewed by me and considered in my medical decision making (see chart for details).   Presented to the emergency department today with primary concern for dehydration.  Patient has been having a hard time swallowing and keeping food and liquids down.  Patient's blood work showed a very mildly elevated creatinine.  Patient was given some Zofran in case it was more nausea however when she underwent p.o. trial it was observed that the patient just started spitting up.  At this point I do   have concerns for dysphasia.  Will plan on admission.   ____________________________________________   FINAL CLINICAL IMPRESSION(S) / ED DIAGNOSES  Final diagnoses:  Dysphagia, unspecified type     Note: This dictation was prepared with Dragon dictation. Any transcriptional errors that result from this process are unintentional     Nance Pear, MD 09/15/18 1311

## 2018-09-15 NOTE — Consult Note (Signed)
[name], MD [address], [place], [place], [place], [address] [address], [place], [place], [place], [address] Phone: [phone]  Fax: [phone]  Consultation  Referring Provider:     Dr. Bridgett Larsson Primary Care Physician:  Venia Carbon, MD Reason for Consultation:     Dysphagia  Date of Admission:  09/15/2018 Date of Consultation:  09/15/2018         HPI:   Toni Henry is a 85 y.o. female brought to the hospital by her daughter due to decreased oral intake for the past week.  Documentation reports that her daughter noted spitting up after eating or drinking.  No signs of food impaction. The patient denies abdominal or flank pain, anorexia, nausea or vomiting, dysphagia, change in bowel habits or black or bloody stools or weight loss.  Daughter is at bedside.  Patient lives in independent living, but daughter states there is some dementia and so patient is a poor historian.  She denies any dysphagia herself.  Daughter does not know if she has been spitting up pieces of food but states that patient spits up her own sputum after trying to eat or drink something for the past week.  No recent EGD or colonoscopy.   Past Medical History:  Diagnosis Date  . Allergic rhinitis due to pollen   . Cancer ([place])    SKIN  . Dry mouth    has been having since 2017  . Dyspnea   . Edema    FEET/ LEGS  . GERD (gastroesophageal reflux disease)   . HOH (hard of hearing)    AIDS  . Macular degeneration   . Memory changes    EARLY ISSUES WITH SHORT TERM MEMORY LOSS  . Osteoporosis   . Wheezing    OCCAS    Past Surgical History:  Procedure Laterality Date  . ABDOMINAL HYSTERECTOMY    . CATARACT EXTRACTION W/PHACO Left 10/30/2017   Procedure: CATARACT EXTRACTION PHACO AND INTRAOCULAR LENS PLACEMENT (IOC);  Surgeon: Birder Robson, MD;  Location: [facility] ORS;  Service: Ophthalmology;  Laterality: Left;  [place] 01:12.4 AP% 13.9 CDE 10.02 Fluid Pack Lot # [id number]  . CATARACT  EXTRACTION W/PHACO Right 01/21/2018   Procedure: CATARACT EXTRACTION PHACO AND INTRAOCULAR LENS PLACEMENT ([place]);  Surgeon: Birder Robson, MD;  Location: [facility] ORS;  Service: Ophthalmology;  Laterality: Right;  [place] 01:08 AP% 23.6 CDE 10.79 Fluid pack lot # [id number]  . HIP FRACTURE SURGERY Left 1/14  . JOINT REPLACEMENT     THR  . TOTAL HIP ARTHROPLASTY Right ~2010    Prior to Admission medications   Medication Sig Start Date End Date Taking? Authorizing Provider  albuterol (PROVENTIL) (2.5 MG/3ML) 0.083% nebulizer solution Take 3 mLs (2.5 mg total) by nebulization every 6 (six) hours as needed for wheezing or shortness of breath. Dx Code J43.9 07/11/18  Yes Venia Carbon, MD  mirtazapine (REMERON) 15 MG tablet Take 1 tablet (15 mg total) by mouth at bedtime. 02/14/18  Yes Venia Carbon, MD  Multiple Vitamin (MULTIVITAMIN) capsule Take 1 capsule by mouth daily.   Yes [provider]  omeprazole (PRILOSEC) 20 MG capsule Take 1 capsule (20 mg total) by mouth daily. 09/12/18  Yes Venia Carbon, MD  SPIRIVA HANDIHALER 18 MCG inhalation capsule USE ONCE DAILY Patient not taking: Reported on 09/15/2018 09/16/17   Venia Carbon, MD    No family history on file.   Social History   Tobacco Use  . Smoking status:  Never Smoker  . Smokeless tobacco: Never Used  Substance Use Topics  . Alcohol use: Yes  . Drug use: No    Allergies as of 09/15/2018  . (No Known Allergies)    Review of Systems:    All systems reviewed and negative except where noted in HPI.   Physical Exam:  Vital signs in last 24 hours: Vitals:   09/15/18 1200 09/15/18 1230 09/15/18 1300 09/15/18 1430  BP: (!) 166/92 (!) 151/76 (!) 149/79 133/77  Pulse:  83 81 73  Resp: (!) 23 19 (!) 30 (!) 23  Temp:      TempSrc:      SpO2:  97% 96% 95%  Weight:      Height:         General:   Pleasant, cooperative in NAD Head:  Normocephalic and atraumatic. Eyes:   No icterus.   Conjunctiva pink.  PERRLA. Ears:  Normal auditory acuity. Neck:  Supple; no masses or thyroidomegaly Lungs: Respirations even and unlabored. Lungs clear to auscultation bilaterally.   No wheezes, crackles, or rhonchi.  Abdomen:  Soft, nondistended, nontender. Normal bowel sounds. No appreciable masses or hepatomegaly.  No rebound or guarding.  Neurologic:  Alert and oriented x3;  grossly normal neurologically. Skin:  Intact without significant lesions or rashes. Cervical Nodes:  No significant cervical adenopathy. Psych:  Alert and cooperative. Normal affect.  LAB RESULTS: Recent Labs    09/15/18 1010  WBC 9.3  HGB 13.9  HCT 42.9  PLT 217   BMET Recent Labs    09/15/18 1010  NA 139  K 4.2  CL 105  CO2 21*  GLUCOSE 104*  BUN 38*  CREATININE 1.10*  CALCIUM 9.0   LFT Recent Labs    09/15/18 1010  PROT 7.2  ALBUMIN 4.1  AST 43*  ALT 33  ALKPHOS 211*  BILITOT 1.7*   PT/INR No results for input(s): LABPROT, INR in the last 72 hours.  STUDIES: No results found.    Impression / Plan:   Toni Henry is a 85 y.o. y/o female with dysphagia  We discussed EGD for evaluation of dysphagia Possible strictures, narrowing, and to rule out malignancy  Signs and symptoms are not entirely convincing for a food impaction and likelihood of a food impaction or food in her esophagus are low at this time.  However, I discussed this with anesthesia, Dr. Rosey Bath.  Since patient has been spitting up her own sputum we cannot safely say that she does not have any residual food in her esophagus from achalasia or possible food impaction.  Ideally, I would like to do her EGD today, but Dr. Rosey Bath states that since patient had sips of water in the ER the EGD would have to wait until after 5 PM even though, the sips of water were small and she spit most of it back up.  In addition, they state that we would have to wait until the already posted OR procedures are completed to do this EGD which would thus be later  in the evening at least after 7 or 8 PM of the earliest, and may be later.  Therefore, given anesthesia availability as per Dr. Rosey Bath, and patient is otherwise nontoxic appearing, she is not drooling from the side of her mouth and is tolerating her own secretions, procedure is now scheduled for tomorrow morning.  Continue n.p.o.  Okay to use wet sponges to wet mount if needed.  If patient has any changes in symptoms, that  is stops tolerating oral secretions, or starts spitting up her own secretions, please page GI on-call to assess for need for sooner procedure overnight  Patient also noted to have elevated liver enzymes No abdominal pain Would recommend right upper quadrant ultrasound to rule out possible obstruction Avoided hepatotoxic drugs Repeat CMP tomorrow and if still elevated, will need further work-up for elevated liver enzymes  I have discussed alternative options, risks & benefits,  which include, but are not limited to, bleeding, infection, perforation,respiratory complication & drug reaction.  The patient and family agrees with this plan & written consent will be obtained.     Thank you for involving me in the care of this patient.      LOS: 0 days   Virgel Manifold, MD  09/15/2018, 3:28 PM

## 2018-09-15 NOTE — Progress Notes (Signed)
Advanced Care Plan.  Purpose of Encounter: CODE STATUS. Parties in Attendance: The patient, her daughter and the me. Patient's Decisional Capacity: Not sure. Medical Story: Toni Henry  is a 85 y.o. female with a known history of skin cancer, dry mouth, GERD and osteoporosis.  The patient is being admitted for dysphagia to rule out esophagus stricture.  I discussed with the patient about her current condition, prognosis and CODE STATUS.  The patient said she cannot decide whether she wants to be resuscitated or intubated if she has cardiopulmonary arrest.  But according to her daughter, the patient is seen DNR status. Plan:  Code Status: DNR. Time spent discussing advance care planning: 17 minutes.

## 2018-09-15 NOTE — H&P (Signed)
[facility] NAME: Toni Henry    MR#:  [id number]  DATE OF BIRTH:  [birth year]  DATE OF ADMISSION:  09/15/2018  PRIMARY CARE PHYSICIAN: Venia Carbon, MD   REQUESTING/REFERRING PHYSICIAN: Dr. Archie Balboa.  CHIEF COMPLAINT:   Chief Complaint  Patient presents with  . Emesis  . Gastroesophageal Reflux   Dysphagia and dehydration. HISTORY OF PRESENT ILLNESS:  Toni Henry  is a 85 y.o. female with a known history of skin cancer, dry mouth, GERD and osteoporosis.  The patient presents the ED with above chief complaints.  She has had some dysphagia over the past 1 week.  She cannot tolerate oral intake.  She spit up fluid or food after eating.  She denies any choking or odynophagia.  She denies any nausea, vomiting or abdominal pain.  She has no bowel movement for 4 days.  Her daughter has concerns about dehydration.  Dr. Archie Balboa request mission for further evaluation. PAST MEDICAL HISTORY:   Past Medical History:  Diagnosis Date  . Allergic rhinitis due to pollen   . Cancer ([place])    SKIN  . Dry mouth    has been having since 2017  . Dyspnea   . Edema    FEET/ LEGS  . GERD (gastroesophageal reflux disease)   . HOH (hard of hearing)    AIDS  . Macular degeneration   . Memory changes    EARLY ISSUES WITH SHORT TERM MEMORY LOSS  . Osteoporosis   . Wheezing    OCCAS    PAST SURGICAL HISTORY:   Past Surgical History:  Procedure Laterality Date  . ABDOMINAL HYSTERECTOMY    . CATARACT EXTRACTION W/PHACO Left 10/30/2017   Procedure: CATARACT EXTRACTION PHACO AND INTRAOCULAR LENS PLACEMENT (IOC);  Surgeon: Birder Robson, MD;  Location: [facility] ORS;  Service: Ophthalmology;  Laterality: Left;  [place] 01:12.4 AP% 13.9 CDE 10.02 Fluid Pack Lot # [id number]  . CATARACT EXTRACTION W/PHACO Right 01/21/2018   Procedure: CATARACT EXTRACTION PHACO AND INTRAOCULAR LENS PLACEMENT ([place]);  Surgeon: Birder Robson, MD;  Location: [facility] ORS;   Service: Ophthalmology;  Laterality: Right;  [place] 01:08 AP% 23.6 CDE 10.79 Fluid pack lot # [id number]  . HIP FRACTURE SURGERY Left 1/14  . JOINT REPLACEMENT     THR  . TOTAL HIP ARTHROPLASTY Right ~2010    SOCIAL HISTORY:   Social History   Tobacco Use  . Smoking status: Never Smoker  . Smokeless tobacco: Never Used  Substance Use Topics  . Alcohol use: Yes    FAMILY HISTORY:  No family history on file.  DRUG ALLERGIES:  No Known Allergies  REVIEW OF SYSTEMS:   Review of Systems  Constitutional: Negative for chills, fever and malaise/fatigue.  HENT: Negative for sore throat.   Eyes: Negative for blurred vision and double vision.  Respiratory: Negative for cough, hemoptysis, shortness of breath, wheezing and stridor.   Cardiovascular: Negative for chest pain, palpitations, orthopnea and leg swelling.  Gastrointestinal: Negative for abdominal pain, blood in stool, diarrhea, heartburn, melena, nausea and vomiting.       Dysphagia and food stuck in esophagus  Genitourinary: Negative for dysuria, flank pain and hematuria.  Musculoskeletal: Negative for back pain and joint pain.  Skin: Negative for rash.  Neurological: Negative for dizziness, sensory change, focal weakness, seizures, loss of consciousness, weakness and headaches.  Endo/Heme/Allergies: Negative for polydipsia.  Psychiatric/Behavioral: Negative for depression. The patient is not nervous/anxious.     MEDICATIONS AT  HOME:   Prior to Admission medications   Medication Sig Start Date End Date Taking? Authorizing Provider  albuterol (PROVENTIL) (2.5 MG/3ML) 0.083% nebulizer solution Take 3 mLs (2.5 mg total) by nebulization every 6 (six) hours as needed for wheezing or shortness of breath. Dx Code J43.9 07/11/18  Yes Venia Carbon, MD  mirtazapine (REMERON) 15 MG tablet Take 1 tablet (15 mg total) by mouth at bedtime. 02/14/18  Yes Venia Carbon, MD  Multiple Vitamin (MULTIVITAMIN) capsule Take 1 capsule by  mouth daily.   Yes [provider]  omeprazole (PRILOSEC) 20 MG capsule Take 1 capsule (20 mg total) by mouth daily. 09/12/18  Yes Venia Carbon, MD  SPIRIVA HANDIHALER 18 MCG inhalation capsule USE ONCE DAILY Patient not taking: Reported on 09/15/2018 09/16/17   Venia Carbon, MD      VITAL SIGNS:  Blood pressure 133/77, pulse 73, temperature 98.5 F (36.9 C), temperature source Oral, resp. rate (!) 23, height 4' 7.5" (1.41 m), weight 38.3 kg, SpO2 95 %.  PHYSICAL EXAMINATION:  Physical Exam  GENERAL:  85 y.o.-year-old patient lying in the bed with no acute distress.  Severe malnutrition. EYES: Pupils equal, round, reactive to light and accommodation. No scleral icterus. Extraocular muscles intact.  HEENT: Head atraumatic, normocephalic. Oropharynx and nasopharynx clear.  NECK:  Supple, no jugular venous distention. No thyroid enlargement, no tenderness.  LUNGS: Normal breath sounds bilaterally, no wheezing, rales,rhonchi or crepitation. No use of accessory muscles of respiration.  CARDIOVASCULAR: S1, S2 normal. No murmurs, rubs, or gallops.  ABDOMEN: Soft, nontender, nondistended. Bowel sounds present. No organomegaly or mass.  EXTREMITIES: No pedal edema, cyanosis, or clubbing.  NEUROLOGIC: Cranial nerves II through XII are intact. Muscle strength 5/5 in all extremities. Sensation intact. Gait not checked.  PSYCHIATRIC: The patient is alert and oriented x 3.  SKIN: No obvious rash, lesion, or ulcer.   LABORATORY PANEL:   CBC Recent Labs  Lab 09/15/18 1010  WBC 9.3  HGB 13.9  HCT 42.9  PLT 217   ------------------------------------------------------------------------------------------------------------------  Chemistries  Recent Labs  Lab 09/15/18 1010  NA 139  K 4.2  CL 105  CO2 21*  GLUCOSE 104*  BUN 38*  CREATININE 1.10*  CALCIUM 9.0  AST 43*  ALT 33  ALKPHOS 211*  BILITOT 1.7*    ------------------------------------------------------------------------------------------------------------------  Cardiac Enzymes No results for input(s): TROPONINI in the last 168 hours. ------------------------------------------------------------------------------------------------------------------  RADIOLOGY:  No results found.    IMPRESSION AND PLAN:   Dysphagia, rule out esophageal stricture. The patient will be admitted to medical floor. N.p.o. with IV fluid support.  GI consult for endoscopy.  Dehydration.  Start normal saline IV and follow-up BMP.  Abnormal liver function test.  Unclear etiology.  Follow-up GI consult.  Severe malnutrition.  Dietitian consult after GI consult.   All the records are reviewed and case discussed with ED provider. Management plans discussed with the patient, her daughter and they are in agreement.  CODE STATUS: DNR.  TOTAL TIME TAKING CARE OF THIS PATIENT: 32 minutes.    Demetrios Loll M.D on 09/15/2018 at 2:46 PM  Between 7am to 6pm - Pager - [phone]  After 6pm go to [email or web address] - Proofreader  [facility]  Office  [phone]  CC: Primary care physician; Venia Carbon, MD   Note: This dictation was prepared with Dragon dictation along with smaller phrase technology. Any transcriptional errors that result from this process are unin

## 2018-09-16 ENCOUNTER — Inpatient Hospital Stay: Payer: Medicare Other | Admitting: Anesthesiology

## 2018-09-16 ENCOUNTER — Encounter
Admission: EM | Disposition: A | Discharge status: Home Health | Payer: Self-pay | Source: Home / Self Care | Attending: Family Medicine

## 2018-09-16 DIAGNOSIS — J9601 Acute respiratory failure with hypoxia: Secondary | ICD-10-CM

## 2018-09-16 DIAGNOSIS — T18128A Food in esophagus causing other injury, initial encounter: Secondary | ICD-10-CM

## 2018-09-16 DIAGNOSIS — T18128D Food in esophagus causing other injury, subsequent encounter: Secondary | ICD-10-CM

## 2018-09-16 DIAGNOSIS — R1310 Dysphagia, unspecified: Secondary | ICD-10-CM

## 2018-09-16 HISTORY — PX: ESOPHAGOGASTRODUODENOSCOPY: SHX5428

## 2018-09-16 LAB — BASIC METABOLIC PANEL
Anion gap: 9 (ref 5–15)
BUN: 36 mg/dL — ABNORMAL HIGH (ref 8–23)
CO2: 22 mmol/L (ref 22–32)
Calcium: 8.5 mg/dL — ABNORMAL LOW (ref 8.9–10.3)
Chloride: 112 mmol/L — ABNORMAL HIGH (ref 98–111)
Creatinine, Ser: 0.97 mg/dL (ref 0.44–1.00)
GFR calc non Af Amer: 49 mL/min — ABNORMAL LOW (ref >60)
GFR, EST AFRICAN AMERICAN: 57 mL/min — AB (ref >60)
Glucose, Bld: 85 mg/dL (ref 70–99)
Potassium: 3.6 mmol/L (ref 3.5–5.1)
Sodium: 143 mmol/L (ref 135–145)

## 2018-09-16 LAB — TRIGLYCERIDES: Triglycerides: 103 mg/dL (ref <150)

## 2018-09-16 LAB — MRSA PCR SCREENING: MRSA by PCR: NEGATIVE

## 2018-09-16 LAB — GLUCOSE, CAPILLARY: Glucose-Capillary: 107 mg/dL — ABNORMAL HIGH (ref 70–99)

## 2018-09-16 SURGERY — EGD (ESOPHAGOGASTRODUODENOSCOPY)
Anesthesia: General | Laterality: Left

## 2018-09-16 MED ORDER — PROPOFOL 1000 MG/100ML IV EMUL
INTRAVENOUS | Status: AC
  Administered 2018-09-16: 30 mg/kg/h
  Filled 2018-09-16: qty 100

## 2018-09-16 MED ORDER — PROPOFOL 10 MG/ML IV BOLUS
INTRAVENOUS | Status: AC
  Filled 2018-09-16: qty 20

## 2018-09-16 MED ORDER — PHENYLEPHRINE HCL 10 MG/ML IJ SOLN
INTRAMUSCULAR | Status: DC | PRN
  Administered 2018-09-16: 100 ug via INTRAVENOUS
  Administered 2018-09-16: 50 ug via INTRAVENOUS
  Administered 2018-09-16 (×2): 100 ug via INTRAVENOUS
  Administered 2018-09-16: 150 ug via INTRAVENOUS

## 2018-09-16 MED ORDER — FENTANYL CITRATE (PF) 100 MCG/2ML IJ SOLN
INTRAMUSCULAR | Status: DC | PRN
  Administered 2018-09-16 (×2): 25 ug via INTRAVENOUS
  Administered 2018-09-16: 50 ug via INTRAVENOUS

## 2018-09-16 MED ORDER — SODIUM CHLORIDE 0.9 % IV SOLN: INTRAVENOUS | Status: DC

## 2018-09-16 MED ORDER — SODIUM CHLORIDE 0.9 % IV SOLN
INTRAVENOUS | Status: DC
  Administered 2018-09-16: 13:00:00 via INTRAVENOUS

## 2018-09-16 MED ORDER — ALBUTEROL SULFATE (2.5 MG/3ML) 0.083% IN NEBU
2.5000 mg | INHALATION_SOLUTION | Freq: Three times a day (TID) | RESPIRATORY_TRACT | Status: DC
  Administered 2018-09-16 – 2018-09-17 (×3): 2.5 mg via RESPIRATORY_TRACT
  Filled 2018-09-16 (×2): qty 3

## 2018-09-16 MED ORDER — SUCCINYLCHOLINE CHLORIDE 20 MG/ML IJ SOLN
INTRAMUSCULAR | Status: AC
  Filled 2018-09-16: qty 1

## 2018-09-16 MED ORDER — SUCCINYLCHOLINE CHLORIDE 20 MG/ML IJ SOLN
INTRAMUSCULAR | Status: DC | PRN
  Administered 2018-09-16: 60 mg via INTRAVENOUS

## 2018-09-16 MED ORDER — POTASSIUM CL IN DEXTROSE 5% 20 MEQ/L IV SOLN
20.0000 meq | INTRAVENOUS | Status: DC
  Administered 2018-09-16 (×2): 20 meq via INTRAVENOUS
  Filled 2018-09-16 (×3): qty 1000

## 2018-09-16 MED ORDER — FENTANYL CITRATE (PF) 100 MCG/2ML IJ SOLN: 25.0000 ug | INTRAMUSCULAR | Status: DC | PRN

## 2018-09-16 MED ORDER — PROPOFOL 500 MG/50ML IV EMUL
INTRAVENOUS | Status: DC | PRN
  Administered 2018-09-16: 60 ug/kg/min via INTRAVENOUS

## 2018-09-16 MED ORDER — FENTANYL CITRATE (PF) 100 MCG/2ML IJ SOLN
INTRAMUSCULAR | Status: AC
  Filled 2018-09-16: qty 2

## 2018-09-16 MED ORDER — PROPOFOL 1000 MG/100ML IV EMUL
5.0000 ug/kg/min | INTRAVENOUS | Status: DC
  Administered 2018-09-17: 30 ug/kg/min via INTRAVENOUS
  Filled 2018-09-16: qty 100

## 2018-09-16 MED ORDER — PROPOFOL 10 MG/ML IV BOLUS
INTRAVENOUS | Status: DC | PRN
  Administered 2018-09-16: 20 mg via INTRAVENOUS
  Administered 2018-09-16: 30 mg via INTRAVENOUS
  Administered 2018-09-16: 40 mg via INTRAVENOUS

## 2018-09-16 MED ORDER — ORAL CARE MOUTH RINSE
15.0000 mL | OROMUCOSAL | Status: DC
  Administered 2018-09-16 – 2018-09-17 (×7): 15 mL via OROMUCOSAL

## 2018-09-16 MED ORDER — CHLORHEXIDINE GLUCONATE 0.12% ORAL RINSE (MEDLINE KIT)
15.0000 mL | Freq: Two times a day (BID) | OROMUCOSAL | Status: DC
  Administered 2018-09-16 – 2018-09-17 (×2): 15 mL via OROMUCOSAL

## 2018-09-16 MED ORDER — LIDOCAINE HCL (PF) 2 % IJ SOLN
INTRAMUSCULAR | Status: AC
  Filled 2018-09-16: qty 10

## 2018-09-16 MED ORDER — LORAZEPAM 0.5 MG PO TABS
0.5000 mg | ORAL_TABLET | Freq: Once | ORAL | Status: AC
  Administered 2018-09-16: 0.5 mg via ORAL
  Filled 2018-09-16: qty 1

## 2018-09-16 MED ORDER — LIDOCAINE HCL (CARDIAC) PF 100 MG/5ML IV SOSY
PREFILLED_SYRINGE | INTRAVENOUS | Status: DC | PRN
  Administered 2018-09-16: 40 mg via INTRAVENOUS

## 2018-09-16 NOTE — Transfer of Care (Signed)
Immediate Anesthesia Transfer of Care Note  Patient: Toni Henry  Procedure(s) Performed: ESOPHAGOGASTRODUODENOSCOPY (EGD) (Left )  Patient Location: ICU  Anesthesia Type:General  Level of Consciousness: sedated  Airway & Oxygen Therapy: Patient remains intubated per anesthesia plan  Post-op Assessment: Report given to RN and Post -op Vital signs reviewed and stable  Post vital signs: stable  Last Vitals:  Vitals Value Taken Time  BP 119/63 09/16/2018  3:14 PM  Temp    Pulse 68 09/16/2018  3:18 PM  Resp 18 09/16/2018  3:18 PM  SpO2 97 % 09/16/2018  3:18 PM  Vitals shown include unvalidated device data.  Last Pain:  Vitals:   09/16/18 1500  TempSrc: Axillary  PainSc:          Complications: No apparent anesthesia complications

## 2018-09-16 NOTE — Anesthesia Preprocedure Evaluation (Signed)
Anesthesia Evaluation  Patient identified by MRN, date of birth, ID band Patient awake    Reviewed: Allergy & Precautions, NPO status , Patient's Chart, lab work & pertinent test results  History of Anesthesia Complications Negative for: history of anesthetic complications  Airway Mallampati: II  TM Distance: >3 FB Neck ROM: Full    Dental  (+) Poor Dentition   Pulmonary neg sleep apnea, COPD,    breath sounds clear to auscultation- rhonchi (-) wheezing      Cardiovascular Exercise Tolerance: Good (-) hypertension(-) CAD, (-) Past MI, (-) Cardiac Stents and (-) CABG  Rhythm:Regular Rate:Normal - Systolic murmurs and - Diastolic murmurs    Neuro/Psych neg Seizures PSYCHIATRIC DISORDERS Dementia negative neurological ROS     GI/Hepatic Neg liver ROS, GERD  ,  Endo/Other  negative endocrine ROSneg diabetes  Renal/GU negative Renal ROS     Musculoskeletal  (+) Arthritis ,   Abdominal (+) - obese,   Peds  Hematology negative hematology ROS (+)   Anesthesia Other Findings Past Medical History: No date: Allergic rhinitis due to pollen No date: Cancer [facility])     Comment:  SKIN No date: Dry mouth     Comment:  has been having since 2017 No date: Dyspnea No date: Edema     Comment:  FEET/ LEGS No date: GERD (gastroesophageal reflux disease) No date: HOH (hard of hearing)     Comment:  AIDS No date: Macular degeneration No date: Memory changes     Comment:  EARLY ISSUES WITH SHORT TERM MEMORY LOSS No date: Osteoporosis No date: Wheezing     Comment:  OCCAS   Reproductive/Obstetrics                             Anesthesia Physical Anesthesia Plan  ASA: III  Anesthesia Plan: General   Post-op Pain Management:    Induction: Intravenous  PONV Risk Score and Plan: 2 and Propofol infusion  Airway Management Planned: Natural Airway  Additional Equipment:   Intra-op Plan:    Post-operative Plan:   Informed Consent: I have reviewed the patients History and Physical, chart, labs and discussed the procedure including the risks, benefits and alternatives for the proposed anesthesia with the patient or authorized representative who has indicated his/her understanding and acceptance.   Patient has DNR.  Suspend DNR and Discussed DNR with power of attorney.   Dental advisory given  Plan Discussed with: CRNA and Anesthesiologist  Anesthesia Plan Comments:         Anesthesia Quick Evaluation

## 2018-09-16 NOTE — [place] Notes (Signed)
Pt had increased confusion last night and anxiety about her EGD this morning. Climbing out of bed and having increased work of breathing. Pt now on 2 L [place] and provider notified about anxiety. Ativan 0.5 ordered. Pt sleeping now. Will continue to monitor.

## 2018-09-16 NOTE — Anesthesia Procedure Notes (Signed)
Procedure Name: Intubation Date/Time: 09/16/2018 1:03 PM Performed by: Lavone Orn, CRNA Pre-anesthesia Checklist: Patient identified, Emergency Drugs available, Suction available, Patient being monitored and Timeout performed Patient Re-evaluated:Patient Re-evaluated prior to induction Oxygen Delivery Method: Circle system utilized Preoxygenation: Pre-oxygenation with 100% oxygen Induction Type: IV induction, Rapid sequence and Cricoid Pressure applied Laryngoscope Size: Mac and 3 Grade View: Grade II Tube type: Oral Tube size: 7.0 mm Number of attempts: 1 Airway Equipment and Method: Stylet Placement Confirmation: ETT inserted through vocal cords under direct vision,  positive ETCO2 and breath sounds checked- equal and bilateral Secured at: 21 cm Tube secured with: Tape Dental Injury: Teeth and Oropharynx as per pre-operative assessment

## 2018-09-16 NOTE — [place] Notes (Signed)
SLP Cancellation Note  Patient Details Name: Toni Henry MRN: [id number] DOB: 1920/04/03   Cancelled treatment:       Reason Eval/Treat Not Completed: Patient at procedure or test/unavailable(chart reviewed; consulted NSG, GI notes. ) Pt is scheduled for an EGD this afternoon per GI notes and NSG report. Will hold off on the BSE until after the EGD results. Recommend frequent oral care for hygiene and stimulation of swallowing; general aspiration precautions. NSG updated, agreed.    Orinda Kenner, MS, CCC-SLP Mikell Kazlauskas 09/16/2018, 12:15 PM

## 2018-09-16 NOTE — Consult Note (Signed)
Reason for Consult: Assistance with ventilator management Referring Physician: Loney Hering, MD  Toni Henry is an 85 y.o. female.  HPI:  Note that the history is taken from available records, patient is intubated, mechanically ventilated, family not available for interview.  This is a 85 year old woman, lifelong never smoker who resides at assisted living at [facility] and who presented on 20 January to [facility] for evaluation of potential dehydration.  The patient has had some issues with dysphagia for approximately a week prior to admission and was evaluated by her primary care physician on 17 January who treated her with PPIs as her symptoms are consistent with gastroesophageal reflux.  Her symptoms worsen dramatically and she was brought to the emergency room where she was noted to regurgitate even water.  She was admitted for volume repletion and to evaluate her issues with dysphaga.  She underwent upper endoscopy today under general anesthesia.  There was a significant amount of food noted in the esophagus and the esophagus was atonic highly suspicious for achalasia.  The gastroenterologist removed as much food as possible from the esophagus and pushed the rest into the stomach.  It was elected to leave the patient intubated due to to high risk for aspiration.  As she is intubated we are asked to help with management of the ventilator.  Of note the patient is DNR and she has consistently stated that she would not want feeding tubes.  She has  issues with cognitive decline.   Past Medical History:  Diagnosis Date  . Allergic rhinitis due to pollen   . Cancer ([place])    SKIN  . Dry mouth    has been having since 2017  . Dyspnea   . Edema    FEET/ LEGS  . GERD (gastroesophageal reflux disease)   . HOH (hard of hearing)    AIDS  . Macular degeneration   . Memory changes    EARLY ISSUES WITH SHORT TERM MEMORY LOSS  . Osteoporosis   . Wheezing    OCCAS    Past Surgical History:   Procedure Laterality Date  . ABDOMINAL HYSTERECTOMY    . CATARACT EXTRACTION W/PHACO Left 10/30/2017   Procedure: CATARACT EXTRACTION PHACO AND INTRAOCULAR LENS PLACEMENT (IOC);  Surgeon: Birder Robson, MD;  Location: [facility] ORS;  Service: Ophthalmology;  Laterality: Left;  [place] 01:12.4 AP% 13.9 CDE 10.02 Fluid Pack Lot # [id number]  . CATARACT EXTRACTION W/PHACO Right 01/21/2018   Procedure: CATARACT EXTRACTION PHACO AND INTRAOCULAR LENS PLACEMENT ([place]);  Surgeon: Birder Robson, MD;  Location: [facility] ORS;  Service: Ophthalmology;  Laterality: Right;  [place] 01:08 AP% 23.6 CDE 10.79 Fluid pack lot # [id number]  . HIP FRACTURE SURGERY Left 1/14  . JOINT REPLACEMENT     THR  . TOTAL HIP ARTHROPLASTY Right ~2010    No family history on file.  Social History:  reports that she has never smoked. She has never used smokeless tobacco. She reports current alcohol use. She reports that she does not use drugs.  Allergies: No Known Allergies  Medications: I have reviewed the patient's current medications.  Results for orders placed or performed during the hospital encounter of 09/15/18 (from the past 48 hour(s))  Lipase, blood     Status: None   Collection Time: 09/15/18 10:10 AM  Result Value Ref Range   Lipase 28 11 - 51 U/L    Comment: Performed at [facility], [address], [place], [place] [address]  Comprehensive metabolic panel  Status: Abnormal   Collection Time: 09/15/18 10:10 AM  Result Value Ref Range   Sodium 139 135 - 145 mmol/L   Potassium 4.2 3.5 - 5.1 mmol/L   Chloride 105 98 - 111 mmol/L   CO2 21 (L) 22 - 32 mmol/L   Glucose, Bld 104 (H) 70 - 99 mg/dL   BUN 38 (H) 8 - 23 mg/dL   Creatinine, Ser 1.10 (H) 0.44 - 1.00 mg/dL   Calcium 9.0 8.9 - 10.3 mg/dL   Total Protein 7.2 6.5 - 8.1 g/dL   Albumin 4.1 3.5 - 5.0 g/dL   AST 43 (H) 15 - 41 U/L   ALT 33 0 - 44 U/L   Alkaline Phosphatase 211 (H) 38 - 126 U/L   Total Bilirubin 1.7 (H) 0.3 - 1.2 mg/dL   GFR calc  non Af Amer 42 (L) >60 mL/min   GFR calc Af Amer 49 (L) >60 mL/min   Anion gap 13 5 - 15    Comment: Performed at [facility], [address] [place], [place] [address]  CBC     Status: Abnormal   Collection Time: 09/15/18 10:10 AM  Result Value Ref Range   WBC 9.3 4.0 - 10.5 K/uL   RBC 4.23 3.87 - 5.11 MIL/uL   Hemoglobin 13.9 12.0 - 15.0 g/dL   HCT 42.9 36.0 - 46.0 %   MCV 101.4 (H) 80.0 - 100.0 fL   MCH 32.9 26.0 - 34.0 pg   MCHC 32.4 30.0 - 36.0 g/dL   RDW 13.7 11.5 - 15.5 %   Platelets 217 150 - 400 K/uL   nRBC 0.0 0.0 - 0.2 %    Comment: Performed at [facility], [address] [place], [place] [address]  Magnesium     Status: None   Collection Time: 09/15/18  4:08 PM  Result Value Ref Range   Magnesium 1.9 1.7 - 2.4 mg/dL    Comment: Performed at [facility], [address] [place], [place] [address]  Urinalysis, Complete w Microscopic     Status: Abnormal   Collection Time: 09/15/18 10:00 PM  Result Value Ref Range   Color, Urine YELLOW (A) YELLOW   APPearance CLEAR (A) CLEAR   Specific Gravity, Urine 1.020 1.005 - 1.030   pH 5.0 5.0 - 8.0   Glucose, UA NEGATIVE NEGATIVE mg/dL   Hgb urine dipstick NEGATIVE NEGATIVE   Bilirubin Urine NEGATIVE NEGATIVE   Ketones, ur 20 (A) NEGATIVE mg/dL   Protein, ur 30 (A) NEGATIVE mg/dL   Nitrite NEGATIVE NEGATIVE   Leukocytes, UA NEGATIVE NEGATIVE   RBC / HPF 0-5 0 - 5 RBC/hpf   WBC, UA 11-20 0 - 5 WBC/hpf   Bacteria, UA NONE SEEN NONE SEEN   Squamous Epithelial / LPF NONE SEEN 0 - 5   Mucus PRESENT    Hyaline Casts, UA PRESENT     Comment: Performed at [facility], [address] [place], [place] [address]  Basic metabolic panel     Status: Abnormal   Collection Time: 09/16/18  2:49 AM  Result Value Ref Range   Sodium 143 135 - 145 mmol/L   Potassium 3.6 3.5 - 5.1 mmol/L   Chloride 112 (H) 98 - 111 mmol/L   CO2 22 22 - 32 mmol/L   Glucose, Bld 85 70 - 99 mg/dL   BUN 36 (H)  8 - 23 mg/dL   Creatinine, Ser 0.97 0.44 - 1.00 mg/dL   Calcium 8.5 (L)  8.9 - 10.3 mg/dL   GFR calc non Af Amer 49 (L) >60 mL/min   GFR calc Af Amer 57 (L) >60 mL/min   Anion gap 9 5 - 15    Comment: Performed at [facility], [address] [place], [place] [address]  Triglycerides     Status: None   Collection Time: 09/16/18  3:23 PM  Result Value Ref Range   Triglycerides 103 <150 mg/dL    Comment: Performed at [facility], [address] [place], [place] [address]  MRSA PCR Screening     Status: None   Collection Time: 09/16/18  3:57 PM  Result Value Ref Range   MRSA by PCR NEGATIVE NEGATIVE    Comment:        The GeneXpert MRSA Assay (FDA approved for NASAL specimens only), is one component of a comprehensive MRSA colonization surveillance program. It is not intended to diagnose MRSA infection nor to guide or monitor treatment for MRSA infections. Performed at [facility], [address] [place], [place] [address]   Glucose, capillary     Status: Abnormal   Collection Time: 09/16/18  7:47 PM  Result Value Ref Range   Glucose-Capillary 107 (H) 70 - 99 mg/dL    No results found.  Review of Systems  Unable to perform ROS: Intubated   Blood pressure (!) 102/55, pulse 71, temperature 99.8 F (37.7 C), temperature source Oral, resp. rate 14, height 4' 7.5" (1.41 m), weight 36.7 kg, SpO2 97 %. Physical Exam  Nursing note and vitals reviewed. Constitutional: She appears cachectic. She is sedated and intubated.  Frail  HENT:  Head: Normocephalic and atraumatic.  Orotracheally intubated  Eyes: Pupils are equal, round, and reactive to light. Conjunctivae are normal. No scleral icterus.  Neck: Neck supple. No JVD present. No tracheal deviation present. No thyromegaly present.  Cardiovascular: Normal rate, regular rhythm, normal heart sounds and intact distal pulses.  Respiratory: She is intubated. She has no wheezes. She has no rales.   Synchronous with the ventilator.  Coarse breath sounds.  GI: Soft. Bowel sounds are normal. She exhibits no distension.  Musculoskeletal:     Comments: Early contractures  Lymphadenopathy:    She has no cervical adenopathy.  Neurological:  Sedated cannot make further assessment.  Skin: Skin is warm and dry.  Psychiatric:  Sedated cannot make further assessment.    Assessment/Plan:  1.  Acute respiratory failure due to dysphagia: Patient has remained on the ventilator for airway protection given high risk for aspiration after endoscopic manipulation.  We will maintain lightly sedated with a RASS goal of -1 on propofol infusion and as needed low-dose fentanyl pushes.  Reviewed ventilator settings with RT.  We will obtain arterial blood gas.  Chest x-ray in the morning prior to extubation.  Wake up assessment in the morning for potential extubation.  2.  Possible achalasia: Given the patient's advanced age and frailty and also her desire of not having invasive procedures done previously expressed, I do not deem this patient a surgical candidate.  Highly recommend evaluation by palliative care.  3. Dysphagia: adjust diet to puree/full liquids only.  4.  Advanced age, debility and frailty: Patient also has been exhibiting cognitive decline.  Recommend limiting medical management with the goal for palliation.      Renold Don, MD [facility]  09/16/2018, 10:04 PM

## 2018-09-16 NOTE — [place] Notes (Signed)
Resting quietly on 30 mcg/kg/hour. Plan to extubate tomorrow am.

## 2018-09-16 NOTE — Anesthesia Post-op Follow-up Note (Signed)
Anesthesia QCDR form completed.        

## 2018-09-16 NOTE — Op Note (Signed)
[facility] Patient Name: Toni Henry Procedure Date: 09/16/2018 12:40 PM MRN: [id number] Account #: [id number] Date of Birth: 11/27/1919 Admit Type: Inpatient Age: 85 Room: [facility] ENDO ROOM 1 Gender: Female Note Status: Finalized Procedure:            Upper GI endoscopy Indications:          Dysphagia Providers:            Nakya Weyand B. Bonna Gains MD, MD Referring MD:         Venia Carbon (Referring MD) Medicines:            Monitored Anesthesia Care Complications:        No immediate complications. Procedure:            Pre-Anesthesia Assessment:                       - The risks and benefits of the procedure and the                        sedation options and risks were discussed with the                        patient. All questions were answered and informed                        consent was obtained.                       - Patient identification and proposed procedure were                        verified prior to the procedure.                       - ASA Grade Assessment: III - A patient with severe                        systemic disease.                       After obtaining informed consent, the endoscope was                        passed under direct vision. Throughout the procedure,                        the patient's blood pressure, pulse, and oxygen                        saturations were monitored continuously. The Endoscope                        was introduced through the mouth, and advanced to the                        second part of duodenum. The upper GI endoscopy was                        accomplished with ease. The patient tolerated the  procedure well. Findings:      Food was found in the entire esophagus. The scope was withdrawn and       replaced with the therapeutic endoscope in order to safely remove a       foreign body. Removal of food was accomplished. Food was seen in the       proximal  esophagus. The scope was withdrawn immediately and patient was       intubated due to large amount of food in esophagus. Due to food being       semi liquid and semi solid and solid in consistency retrieval with roth       net alone was difficult. Large channel therapeutic scope was used which       allowed better suctioning of semi liquid to semi solid contents and the       rest of the large food pieces were removed with roth net. No large food       pieces left in the esophagus after the exam. Some food was advanced into       the stomach as well and large amount of food was seen in the stomach.      No appreciable esophageal motility was noted. In addition, a wide open       lower esophageal sphincter was found. There was no resistance to       endoscope advancement into the stomach. The esophagus appeared dilated.      A large amount of food (residue) was found in the gastric fundus.      The exam of the stomach was otherwise normal.      The examined duodenum was normal. Impression:           - Food in the esophagus. Removal was successful.                       - The examination was suspicious for achalasia.                       - A large amount of food (residue) in the stomach.                       - Normal examined duodenum. Recommendation:       - Due to the large amount of food in the stomach we                        discussed with the family, that extubating the patient                        today could lead to possible aspiration of stomach                        contents. In addition with possible underlying                        achalasia patient may not be able to protect her airway                        well from regurgitation of stomach contents. Patient                        and family agreeable to leaving patient intubating  today and extubating tomorrow.                       - Ok to extubate tomorrow after evaluation by ICU                         intensivisit and surgery consult                       - Surgery consult for evaluation of treatment of                        achalasia.                       If surgery consult does not recommend surgery patient                        will need to be referred to [facility] or [facility] as an                        outpatient to evaluate for pneumatic dilation vs Botox                       - After extubation patient will need to be on an almost                        pureed diet. Small frequent meals. Consult nutrition                        while inpatient to assist with this.                       - The findings and recommendations were discussed with                        the patient's family. Procedure Code(s):    --- Professional ---                       [phone], Esophagogastroduodenoscopy, flexible, transoral;                        with removal of foreign body(s) Diagnosis Code(s):    --- Professional ---                       R48.546E, Food in esophagus causing other injury,                        initial encounter                       T18.2XXA, Foreign body in stomach, initial encounter                       R13.10, Dysphagia, unspecified CPT copyright 2018 American Medical Association. All rights reserved. The codes documented in this report are preliminary and upon coder review may  be revised to meet current compliance requirements.  [name], MD Margretta Sidle B. Bonna Gains MD, MD 09/16/2018 2:32:41 PM This report has been signed electronically. Number of Addenda: 0 Note Initiated On: 09/16/2018 12:40 PM Estimated Blood Loss: Estimated  blood loss: none.      [facility]

## 2018-09-16 NOTE — [place] Notes (Signed)
   [name], MD [address] [place], [place], [place], [address] [address], [place], [place], [place], [address] Phone: [phone]  Fax: [phone]   Subjective: No abdominal pain. No Nausea/vomiting.    Objective: Exam: Vital signs in last 24 hours: Vitals:   09/15/18 1602 09/15/18 1914 09/15/18 2259 09/16/18 0352  BP: (!) 151/73 (!) 153/82  (!) 144/69  Pulse: 75 79  72  Resp: 20 17  16   Temp: 98.2 F (36.8 C) 98.4 F (36.9 C)  98 F (36.7 C)  TempSrc: Oral Oral    SpO2: 96% 94% (!) 89% 93%  Weight:      Height:       Weight change:   Intake/Output Summary (Last 24 hours) at 09/16/2018 1215 Last data filed at 09/16/2018 1054 Gross per 24 hour  Intake 926.49 ml  Output 600 ml  Net 326.49 ml    General: No acute distress, AAO x3 Abd: Soft, NT/ND, No HSM Skin: Warm, no rashes Neck: Supple, Trachea midline   Lab Results: Lab Results  Component Value Date   WBC 9.3 09/15/2018   HGB 13.9 09/15/2018   HCT 42.9 09/15/2018   MCV 101.4 (H) 09/15/2018   PLT 217 09/15/2018   Micro Results: No results found for this or any previous visit (from the past 240 hour(s)). Studies/Results: No results found. Medications:  Scheduled Meds: . albuterol  2.5 mg Nebulization TID  . enoxaparin (LOVENOX) injection  30 mg Subcutaneous Q24H  . mouth rinse  15 mL Mouth Rinse BID  . mirtazapine  15 mg Oral QHS   Continuous Infusions: . sodium chloride    . dextrose 5 % with KCl 20 mEq / L 20 mEq (09/16/18 1054)   PRN Meds:.acetaminophen **OR** acetaminophen, HYDROcodone-acetaminophen, ondansetron **OR** ondansetron (ZOFRAN) IV   Assessment: Active Problems:   Dysphagia    Plan: Will proceed with EGD today to evaluate etiology of dysphagia with possible dilation if needed Continue n.p.o.  Repeat CMP has been ordered by primary team and is pending Right upper quadrant ultrasound pending as well  Avoid hepatotoxic drugs  I have discussed  alternative options, risks & benefits,  which include, but are not limited to, bleeding, infection, perforation,respiratory complication & drug reaction.  The patient agrees with this plan & written consent will be obtained.      LOS: 1 day   [name], MD 09/16/2018, 12:15 PM

## 2018-09-16 NOTE — Progress Notes (Addendum)
Toni Henry NAME: Toni Henry    MR#:  [id number]  DATE OF BIRTH:  12/10/1919  SUBJECTIVE:  Status post EGD September 16, 2018 by gastroenterology/Dr. Sampson Si noted severe achalasia with food impaction, patient had to remain intubated overnight, transferred to the ICU for further care, patient extubated this morning by critical care attending, the patient/patient's family requesting no further aggressive intervention, plan is for home with hospice, pured diet given severe dysphagia/achalasia, possible transfer to the floor later today  REVIEW OF SYSTEMS:  CONSTITUTIONAL: No fever, fatigue or weakness.  EYES: No blurred or double vision.  EARS, NOSE, AND THROAT: No tinnitus or ear pain.  RESPIRATORY: No cough, shortness of breath, wheezing or hemoptysis.  CARDIOVASCULAR: No chest pain, orthopnea, edema.  GASTROINTESTINAL: No nausea, vomiting, diarrhea or abdominal pain.  GENITOURINARY: No dysuria, hematuria.  ENDOCRINE: No polyuria, nocturia,  HEMATOLOGY: No anemia, easy bruising or bleeding SKIN: No rash or lesion. MUSCULOSKELETAL: No joint pain or arthritis.   NEUROLOGIC: No tingling, numbness, weakness.  PSYCHIATRY: No anxiety or depression.   ROS  DRUG ALLERGIES:  No Known Allergies  VITALS:  Blood pressure (!) 144/69, pulse 72, temperature 98 F (36.7 C), resp. rate 16, height 4' 7.5" (1.41 m), weight 38.3 kg, SpO2 93 %.  PHYSICAL EXAMINATION:  GENERAL:  85 y.o.-year-old patient lying in the bed with no acute distress.  EYES: Pupils equal, round, reactive to light and accommodation. No scleral icterus. Extraocular muscles intact.  HEENT: Head atraumatic, normocephalic. Oropharynx and nasopharynx clear.  NECK:  Supple, no jugular venous distention. No thyroid enlargement, no tenderness.  LUNGS: Normal breath sounds bilaterally, no wheezing, rales,rhonchi or crepitation. No use of accessory muscles of respiration.   CARDIOVASCULAR: S1, S2 normal. No murmurs, rubs, or gallops.  ABDOMEN: Soft, nontender, nondistended. Bowel sounds present. No organomegaly or mass.  EXTREMITIES: No pedal edema, cyanosis, or clubbing.  NEUROLOGIC: Cranial nerves II through XII are intact. Muscle strength 5/5 in all extremities. Sensation intact. Gait not checked.  PSYCHIATRIC: The patient is alert and oriented x 3.  SKIN: No obvious rash, lesion, or ulcer.   Physical Exam LABORATORY PANEL:   CBC Recent Labs  Lab 09/15/18 1010  WBC 9.3  HGB 13.9  HCT 42.9  PLT 217   ------------------------------------------------------------------------------------------------------------------  Chemistries  Recent Labs  Lab 09/15/18 1010 09/15/18 1608 09/16/18 0249  NA 139  --  143  K 4.2  --  3.6  CL 105  --  112*  CO2 21*  --  22  GLUCOSE 104*  --  85  BUN 38*  --  36*  CREATININE 1.10*  --  0.97  CALCIUM 9.0  --  8.5*  MG  --  1.9  --   AST 43*  --   --   ALT 33  --   --   ALKPHOS 211*  --   --   BILITOT 1.7*  --   --    ------------------------------------------------------------------------------------------------------------------  Cardiac Enzymes No results for input(s): TROPONINI in the last 168 hours. ------------------------------------------------------------------------------------------------------------------  RADIOLOGY:  No results found.  ASSESSMENT AND PLAN:  *Acute dysphagia Secondary to severe achalasia noted on EGD September 16, 2018 EGD procedure complicated requiring continued intubation, transferred to ICU for continued airway protection given severe food impaction within the esophagus, gastroenterology recommending pured foods going forward Plans for home with hospice at discharge given dismal prognosis and advanced age  *Acute complicated EGD procedure Patient did require ICU overnight  given severe impacted food within the esophagus Extubated today by critical care attending, plan  for discussed with cardiology.  Possible transfer to the floor later today. Plan is for hospice at home given dismal prognosis and advanced age  *Acute dehydration  Resolved with IV fluids   *Chronic moderate to severe protein calorie/energy malnutrition  Dietary and physical therapy consulted while in house   *Acute abnormal LFTs  Etiology unknown  Gastroenterology consulted while in house  No further intervention  Plan of care as stated above   *History of bronchospasm Stable Continue breathing treatments  All the records are reviewed and case discussed with Care Management/Social Workerr. Management plans discussed with the patient, family and they are in agreement.  CODE STATUS: dnr  TOTAL TIME TAKING CARE OF THIS PATIENT: 35 minutes.     POSSIBLE D/C IN 1-2 DAYS, DEPENDING ON CLINICAL CONDITION.   Toni Henry M.D on 09/16/2018   Between 7am to 6pm - Pager - [phone]  After 6pm go to [email or web address] - password [email or web address] [facility]  Office  [phone]  CC: Primary care physician; Venia Carbon, MD  Note: This dictation was prepared with Dragon dictation along with smaller phrase technology. Any transcriptional errors that result from this process are unintentional.

## 2018-09-16 NOTE — Progress Notes (Signed)
Spoke with Darlyn Chamber, NP about checking CBG.  NP gave order for q4H CBG.

## 2018-09-17 ENCOUNTER — Inpatient Hospital Stay: Payer: Medicare Other

## 2018-09-17 ENCOUNTER — Encounter: Payer: Self-pay | Admitting: Gastroenterology

## 2018-09-17 DIAGNOSIS — J9600 Acute respiratory failure, unspecified whether with hypoxia or hypercapnia: Secondary | ICD-10-CM

## 2018-09-17 DIAGNOSIS — Z9911 Dependence on respirator [ventilator] status: Secondary | ICD-10-CM

## 2018-09-17 DIAGNOSIS — K220 Achalasia of cardia: Principal | ICD-10-CM

## 2018-09-17 DIAGNOSIS — Z7189 Other specified counseling: Secondary | ICD-10-CM

## 2018-09-17 DIAGNOSIS — Z515 Encounter for palliative care: Secondary | ICD-10-CM

## 2018-09-17 DIAGNOSIS — T18128S Food in esophagus causing other injury, sequela: Secondary | ICD-10-CM

## 2018-09-17 LAB — BLOOD GAS, ARTERIAL
Acid-base deficit: 1.7 mmol/L (ref 0.0–2.0)
Bicarbonate: 22.1 mmol/L (ref 20.0–28.0)
FIO2: 0.5
MECHVT: 450 mL
O2 SAT: 99.4 %
PCO2 ART: 34 mmHg (ref 32.0–48.0)
PEEP: 5 cmH2O
Patient temperature: 37
RATE: 14 resp/min
pH, Arterial: 7.42 (ref 7.350–7.450)
pO2, Arterial: 154 mmHg — ABNORMAL HIGH (ref 83.0–108.0)

## 2018-09-17 LAB — GLUCOSE, CAPILLARY
Glucose-Capillary: 157 mg/dL — ABNORMAL HIGH (ref 70–99)
Glucose-Capillary: 173 mg/dL — ABNORMAL HIGH (ref 70–99)
Glucose-Capillary: 177 mg/dL — ABNORMAL HIGH (ref 70–99)
Glucose-Capillary: 95 mg/dL (ref 70–99)
Glucose-Capillary: 97 mg/dL (ref 70–99)
Glucose-Capillary: 99 mg/dL (ref 70–99)

## 2018-09-17 LAB — COMPREHENSIVE METABOLIC PANEL
ALT: 23 U/L (ref 0–44)
AST: 22 U/L (ref 15–41)
Albumin: 2.9 g/dL — ABNORMAL LOW (ref 3.5–5.0)
Alkaline Phosphatase: 136 U/L — ABNORMAL HIGH (ref 38–126)
Anion gap: 8 (ref 5–15)
BUN: 44 mg/dL — ABNORMAL HIGH (ref 8–23)
CO2: 21 mmol/L — ABNORMAL LOW (ref 22–32)
Calcium: 8.3 mg/dL — ABNORMAL LOW (ref 8.9–10.3)
Chloride: 111 mmol/L (ref 98–111)
Creatinine, Ser: 1.06 mg/dL — ABNORMAL HIGH (ref 0.44–1.00)
GFR calc Af Amer: 51 mL/min — ABNORMAL LOW (ref >60)
GFR calc non Af Amer: 44 mL/min — ABNORMAL LOW (ref >60)
GLUCOSE: 191 mg/dL — AB (ref 70–99)
Potassium: 3.5 mmol/L (ref 3.5–5.1)
Sodium: 140 mmol/L (ref 135–145)
Total Bilirubin: 1.1 mg/dL (ref 0.3–1.2)
Total Protein: 5.4 g/dL — ABNORMAL LOW (ref 6.5–8.1)

## 2018-09-17 MED ORDER — ADULT MULTIVITAMIN LIQUID CH
15.0000 mL | Freq: Every day | ORAL | Status: DC
  Administered 2018-09-18: 15 mL
  Filled 2018-09-17 (×2): qty 15

## 2018-09-17 MED ORDER — POTASSIUM CL IN DEXTROSE 5% 20 MEQ/L IV SOLN
20.0000 meq | INTRAVENOUS | Status: DC
  Filled 2018-09-17: qty 1000

## 2018-09-17 MED ORDER — ALBUTEROL SULFATE (2.5 MG/3ML) 0.083% IN NEBU: 2.5000 mg | INHALATION_SOLUTION | RESPIRATORY_TRACT | Status: DC | PRN

## 2018-09-17 MED ORDER — LACTATED RINGERS IV BOLUS
250.0000 mL | Freq: Once | INTRAVENOUS | Status: AC
  Administered 2018-09-17: 250 mL via INTRAVENOUS

## 2018-09-17 MED ORDER — ORAL CARE MOUTH RINSE
15.0000 mL | Freq: Two times a day (BID) | OROMUCOSAL | Status: DC
  Administered 2018-09-17 – 2018-09-18 (×2): 15 mL via OROMUCOSAL

## 2018-09-17 MED ORDER — MIRTAZAPINE 15 MG PO TBDP
15.0000 mg | ORAL_TABLET | Freq: Every day | ORAL | Status: DC
  Administered 2018-09-17: 15 mg via ORAL
  Filled 2018-09-17 (×2): qty 1

## 2018-09-17 MED ORDER — ENSURE ENLIVE PO LIQD
237.0000 mL | Freq: Three times a day (TID) | ORAL | Status: DC
  Administered 2018-09-17 – 2018-09-18 (×3): 237 mL via ORAL

## 2018-09-17 NOTE — Anesthesia Postprocedure Evaluation (Signed)
Anesthesia Post Note  Patient: Toni Henry  Procedure(s) Performed: ESOPHAGOGASTRODUODENOSCOPY (EGD) (Left )  Patient location during evaluation: ICU Anesthesia Type: General Level of consciousness: sedated Pain management: pain level controlled Vital Signs Assessment: post-procedure vital signs reviewed and stable Respiratory status: patient on ventilator - see flowsheet for VS Cardiovascular status: blood pressure returned to baseline Postop Assessment: no apparent nausea or vomiting Anesthetic complications: no     Last Vitals:  Vitals:   09/17/18 0835 09/17/18 0839  BP:    Pulse:    Resp:    Temp:    SpO2: 98% 98%    Last Pain:  Vitals:   09/17/18 0800  TempSrc: Axillary  PainSc:                  Hedda Slade

## 2018-09-17 NOTE — Progress Notes (Signed)
Spoke with Darlyn Chamber, NP and made him aware that patient has had only 150 cc of urine output this shift with soft blood pressures, maintaining MAP of 65 or greater for majority of shift. NP stated he would order a bolus.

## 2018-09-17 NOTE — Progress Notes (Signed)
Patient was extubated to 3L nasal cannula per Dr.'s order. No stridor present. Patient's oxygen saturation is 97% on 3L nasal cannula.

## 2018-09-17 NOTE — Progress Notes (Signed)
Toni Antigua, MD [address], [place], [place], [place], [address] [address], [place], [place], [place], [address] Phone: [phone]  Fax: [phone]   Subjective: Patient extubated this a.m. and has been doing well.  No nausea or vomiting.  No abdominal pain.   Objective: Exam: Vital signs in last 24 hours: Vitals:   09/17/18 0839 09/17/18 0900 09/17/18 1000 09/17/18 1100  BP:  (!) 111/52 135/79 (!) 116/59  Pulse:  62 89 74  Resp:  16 (!) 37 (!) 24  Temp:      TempSrc:      SpO2: 98%     Weight:      Height:       Weight change: 0 kg  Intake/Output Summary (Last 24 hours) at 09/17/2018 1245 Last data filed at 09/17/2018 0600 Gross per 24 hour  Intake 1408.78 ml  Output 150 ml  Net 1258.78 ml    General: No acute distress, AAO x3 Abd: Soft, NT/ND, No HSM Skin: Warm, no rashes Neck: Supple, Trachea midline   Lab Results: Lab Results  Component Value Date   WBC 9.3 09/15/2018   HGB 13.9 09/15/2018   HCT 42.9 09/15/2018   MCV 101.4 (H) 09/15/2018   PLT 217 09/15/2018   Micro Results: Recent Results (from the past 240 hour(s))  MRSA PCR Screening     Status: None   Collection Time: 09/16/18  3:57 PM  Result Value Ref Range Status   MRSA by PCR NEGATIVE NEGATIVE Final    Comment:        The GeneXpert MRSA Assay (FDA approved for NASAL specimens only), is one component of a comprehensive MRSA colonization surveillance program. It is not intended to diagnose MRSA infection nor to guide or monitor treatment for MRSA infections. Performed at [facility], [address] [place], [place] [address]    Studies/Results: Dg Chest Port 1 View  Result Date: 09/17/2018 CLINICAL DATA:  Acute respiratory failure EXAM: PORTABLE CHEST 1 VIEW COMPARISON:  06/21/2018 FINDINGS: Endotracheal tube tip just below the clavicular heads and above the carina. There is cardiomegaly and aortic tortuosity distorted by significant leftward rotation.  Haziness of the lower chest, dense on the left. Probable hyperinflation. No pneumothorax or air bronchogram. IMPRESSION: 1. Endotracheal tube in unremarkable position. 2. Probable pleural effusions. There is superimposed atelectasis or infection. Electronically Signed   By: Monte Fantasia M.D.   On: 09/17/2018 06:21   Medications:  Scheduled Meds: . chlorhexidine gluconate (MEDLINE KIT)  15 mL Mouth Rinse BID  . enoxaparin (LOVENOX) injection  30 mg Subcutaneous Q24H  . mouth rinse  15 mL Mouth Rinse 10 times per day  . mirtazapine  15 mg Oral QHS   Continuous Infusions: . dextrose 5 % with KCl 20 mEq / L 10 mL/hr at 09/17/18 1059   PRN Meds:.acetaminophen **OR** acetaminophen, albuterol, [DISCONTINUED] ondansetron **OR** ondansetron (ZOFRAN) IV   Assessment: Active Problems:   Dysphagia   Food impaction of esophagus    Plan: Patient had food impaction and dysphasia due to her achalasia Dr. Alva Garnet from the ICU states they have discussed things with the family and palliative care has been consulted with possible home hospice and they do not want any further procedures.  Dr. Alva Garnet stated they would be working with nutritionist to develop a diet plan for the patient.  Given her possible achalasia I would recommend pured foods as solid foods can get stuck in her esophagus again.  Head of bed elevated at  all times to 30 to 45 degrees.  Eating small quantities at one time to allow the esophagus to clear before taking further bites would help to.  Family at bedside, I discussed with them that if they need any assistance from GI standpoint in the future, in regard to discussing treatment for achalasia we are happy to assist in taking follow-up with [place] in clinic.  GI service will sign off, please page with any questions   LOS: 2 days   Toni Antigua, MD 09/17/2018, 12:45 PM

## 2018-09-17 NOTE — Progress Notes (Signed)
Palliative Medicine RN Note: Rec'd call from pt's daughter Izora Gala [phone]). She would like someone to explain hospice in detail to her. I reached out to Santiago Glad with [facility]; she will contact Kathie Rhodes, PMT NP then call the family.  Marjie Skiff Takiesha Mcdevitt, RN, BSN, [facility] Palliative Medicine Team 09/17/2018 3:03 PM Office [phone]

## 2018-09-17 NOTE — Care Management Note (Signed)
Case Management Note  Patient Details  Name: Toni Henry MRN: [id number] Date of Birth: 1920/02/06  Subjective/Objective:     Patient brought in to the ED by her daughter for dehydration related to GERD, patient had been to her PCP this past Friday and put on omeprazole with no improvement.  GI was consulted and patient underwent upper GI under general anesthesia.  The endoscopy showed a large amount of food lodged in the esophagus.  Patient was extubated this morning and is tolerating Donald.  Daughter Darien Ramus is at the bedside.  Patient is from [facility] independent living, daughter reports that patient is independent in bathing and dressing and walking.  Personal care aides come in from [facility] during the day to assist with meals and cleaning.  [facility] provides transportation.  Patient has a walker but rarely uses it.  Daughter reports some mild dementia and last PCP visit  recommended that patient would benefit from a memory care facility.  PCP verified as Dr. Garnetta Buddy.   Daughter reports that she wants her mother to stay in independent living if at all possible.  Palliative consult has been placed, patient cannot tolerate food at this time as her esophagus is not functional, recommended only for at most full liquids in very small amts.  PT will evaluate today.  RNCM discussed outpatient palliative and hospice care.  Daughter was interested in palliative care and was not ready to discuss hospice.  RNCM will cont to follow. Doran Clay RN BSN [phone]             Action/Plan:   Expected Discharge Date:                  Expected Discharge Plan:     In-House Referral:     Discharge planning Services  CM Consult  Post Acute Care Choice:    Choice offered to:     DME Arranged:    DME Agency:     HH Arranged:    HH Agency:     Status of Service:  In process, will continue to follow  If discussed at Long Length of Stay Meetings, dates discussed:    Additional  Comments:  RAYGEN LINQUIST, RN 09/17/2018, 11:16 AM

## 2018-09-17 NOTE — Consult Note (Signed)
Consultation Note Date: 09/17/2018   Patient Name: Toni Henry  DOB: 1920/06/29  MRN: [id number]  Age / Sex: 85 y.o., female  PCP: Venia Carbon, MD Referring Physician: Gorden Harms, MD  Reason for Consultation: Establishing goals of care  HPI/Patient Profile: 85 y.o. female  with past medical history of GERD, dementia, and malnutrition admitted on 09/15/2018 with inability to tolerate PO intake.  Patient had EGD and was found to have food impaction and suspected achalasia. Patient was intubated post EGD for airway protection. Extubated this morning. Pureed foods have been recommended and eating small quantities at a time. PMT consulted for [place].   Clinical Assessment and Goals of Care: I have reviewed medical records including EPIC notes, labs and imaging, received report from RN, assessed the patient and then met with patient's daughter, Darien Ramus, to discuss diagnosis prognosis, GOC, EOL wishes, disposition and options.  I introduced Palliative Medicine as specialized medical care for people living with serious illness. It focuses on providing relief from the symptoms and stress of a serious illness. The goal is to improve quality of life for both the patient and the family.  As far as functional and nutritional status, Darien Ramus tells me patient has been independent with ADLs. She is ambulatory. Describes a good functional status. Martie does speak of weight loss and poor PO intake. The patient lives at [facility] in independent living.    We discussed her current illness, specifically talked about new diagnosis of achalasia and malnutrition in the setting of dementia. We discussed concern about patient being able to sustain herself through nutrition and hydration. Dietician consulted to discuss best ways to meet patient's nutritional needs - family eager to speak with them.  I attempted to elicit values and goals of care important to the  patient.  Patient would not want a feeding tube and would not want life artificially prolonged.   The difference between aggressive medical intervention and comfort care was considered in light of the patient's goals of care. Martie hopes to avoid aggressive medical interventions - no surgery, intubation, or endoscopies.  Advance directives, concepts specific to code status, artifical feeding and hydration, and rehospitalization were considered and discussed. Confirmed DNR.   Hospice and Palliative Care services outpatient were explained and offered. We discussed differences between palliative care and hospice. Darien Ramus is also considering rehab facility. Darien Ramus shares that she's not sure there is much for her mother to "gain" from rehab and she would prefer that she return to her independent living where she is comfortable. We discussed types of support she could have at home - hospice vs home health w/palliative. We discussed that, overall, Martie's goals for her mother's care are in line with hospice care - she wants to focus on quality of life and avoid aggressive medical interventions. Darien Ramus is interested in returning to independent living with hospice but would like to discuss this with her sister, Izora Gala, before making decisions.  Questions and concerns were addressed. The family was encouraged to call with questions or concerns.   Primary Decision Maker NEXT OF KIN - daughters Darien Ramus and Izora Gala  SUMMARY OF RECOMMENDATIONS   - Avoid aggressive medical interventions, focus on quality of life, DNR - daughter, Darien Ramus, interested in returning to ILF with hospice - she plans to speak to her sister about this before making final decision - PMT will follow up tomorrow - will attempt to have both daughters participate in conversation via speaker phone - Dietician consult later today  Code Status/Advance Care Planning:  DNR   Palliative Prophylaxis:   Aspiration and Delirium  Protocol  Additional Recommendations (Limitations, Scope, Preferences):  Avoid Hospitalization, Minimize Medications and No Artificial Feeding  Psycho-social/Spiritual:   Desire for further Chaplaincy support:no  Additional Recommendations: Education on Hospice  Prognosis:   < 6 months  Discharge Planning: To Be Determined      Primary Diagnoses: Present on Admission: **None**   I have reviewed the medical record, interviewed the patient and family, and examined the patient. The following aspects are pertinent.  Past Medical History:  Diagnosis Date  . Allergic rhinitis due to pollen   . Cancer ([place])    SKIN  . Dry mouth    has been having since 2017  . Dyspnea   . Edema    FEET/ LEGS  . GERD (gastroesophageal reflux disease)   . HOH (hard of hearing)    AIDS  . Macular degeneration   . Memory changes    EARLY ISSUES WITH SHORT TERM MEMORY LOSS  . Osteoporosis   . Wheezing    OCCAS   Social History   Socioeconomic History  . Marital status: Widowed    Spouse name: Not on file  . Number of children: 2  . Years of education: Not on file  . Highest education level: Not on file  Occupational History  . 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 Financial resource strain: Not on file  . Food insecurity:    Worry: Not on file    Inability: Not on file  . Transportation needs:    Medical: Not on file    Non-medical: Not on file  Tobacco Use  . Smoking status: Never Smoker  . Smokeless tobacco: Never Used  Substance and Sexual Activity  . Alcohol use: Yes  . Drug use: No  . Sexual activity: Not on file  Lifestyle  . Physical activity:    Days per week: Not on file    Minutes per session: Not on file  . Stress: Not on file  Relationships  . Social connections:    Talks on phone: Not on file    Gets together: Not on file    Attends religious service: Not on file    Active member of club or organization: Not on file     Attends meetings of clubs or organizations: Not on file    Relationship status: Not on file  Other Topics Concern  . Not on file  Social History Narrative   Widowed ~2012   2 daughters      Has living will   Designated SIL Richard as health care POA   Requests DNR--form done 11/12/12   Would not accept feeding tube   No family history on file. Scheduled Meds: . chlorhexidine gluconate (MEDLINE KIT)  15 mL Mouth Rinse BID  . enoxaparin (LOVENOX) injection  30 mg Subcutaneous Q24H  . mouth rinse  15 mL Mouth Rinse 10 times per day  . mirtazapine  15 mg Oral QHS   Continuous Infusions: . dextrose 5 % with KCl 20 mEq / L 10 mL/hr at 09/17/18 1059   PRN Meds:.acetaminophen **OR** acetaminophen, albuterol, [DISCONTINUED] ondansetron **OR** ondansetron (ZOFRAN) IV No Known Allergies Review of Systems  Unable to perform ROS: Dementia    Physical Exam Constitutional:      Appearance: She is cachectic.  Cardiovascular:     Rate and Rhythm: Normal rate and regular rhythm.  Pulmonary:  Effort: Pulmonary effort is normal.     Breath sounds: Normal breath sounds.  Abdominal:     Palpations: Abdomen is soft.  Musculoskeletal:     Right lower leg: No edema.     Left lower leg: No edema.  Neurological:     Mental Status: She is lethargic.     Vital Signs: BP (!) 116/59   Pulse 74   Temp (!) 97.1 F (36.2 C) (Axillary)   Resp (!) 24   Ht 4' 7.5" (1.41 m)   Wt 36.7 kg   SpO2 98%   BMI 18.47 kg/m  Pain Scale: CPOT   Pain Score: 0-No pain   SpO2: SpO2: 98 % O2 Device:SpO2: 98 % O2 Flow Rate: .O2 Flow Rate (L/min): 2 L/min  IO: Intake/output summary:   Intake/Output Summary (Last 24 hours) at 09/17/2018 1531 Last data filed at 09/17/2018 0600 Gross per 24 hour  Intake 808.78 ml  Output 150 ml  Net 658.78 ml    LBM: Last BM Date: (pt unaware of last BM) Baseline Weight: Weight: 38.3 kg Most recent weight: Weight: 36.7 kg     Palliative Assessment/Data: PPS  20%    Time Total: 70 minutes Greater than 50%  of this time was spent counseling and coordinating care related to the above assessment and plan.  Juel Burrow, DNP, AGNP-C Palliative Medicine Team [phone] Pager: [phone]

## 2018-09-17 NOTE — Evaluation (Signed)
Physical Therapy Evaluation Patient Details Name: Toni Henry MRN: [id number] DOB: [birth year] Today's Date: 09/17/2018   History of Present Illness  Pt is a 85 y.o. female with a known history of skin cancer, dry mouth, GERD and osteoporosis.  The patient presented the ED with chief complaint of emesis and gastroesophageal reflux.  She had some dysphagia over the past 1 week.  She cannot tolerate oral intake.  She spit up fluid or food after eating.  She denied any choking or odynophagia.  Assessment includes: Acute respiratory failure due to dysphagia with pt intubated now extubated, possible achalasia, and dysphagia.    Clinical Impression  Pt presents with deficits in strength, transfers, mobility, gait, balance, and activity tolerance.  Pt required extensive assistance with bed mobility tasks and once in sitting at the EOB presented with min posterior instability that improved with anterior weight shifting activities.  Pt was able to stand at the EOB but again required extensive assist and once in standing presented with significant posterior instability requiring constant assist to prevent LOB.  Pt presents with a significant decline in functional mobility compared to her baseline that includes Ind amb without an AD and would not be appropriate to return to her prior living situation at this time.  Pt will benefit from PT services in a SNF setting upon discharge to safely address above deficits for decreased caregiver assistance and eventual return to PLOF.      Follow Up Recommendations SNF;Supervision for mobility/OOB    Equipment Recommendations  Other (comment)(TBD at next venue of care)    Recommendations for Other Services       Precautions / Restrictions Precautions Precautions: Fall Restrictions Weight Bearing Restrictions: No      Mobility  Bed Mobility Overal bed mobility: Needs Assistance Bed Mobility: Supine to Sit;Sit to Supine     Supine to sit: Max assist Sit  to supine: Max assist   General bed mobility comments: Max A for BLEs in/out of bed and for trunk control  Transfers Overall transfer level: Needs assistance Equipment used: Rolling walker (2 wheeled) Transfers: Sit to/from Stand Sit to Stand: Max assist;From elevated surface         General transfer comment: Pt required extensive assist to prevent posterior LOB during sit to/from stand transfers and while in standing at the EOB  Ambulation/Gait             General Gait Details: Unable/unsafe to attempt  Stairs            Wheelchair Mobility    Modified Rankin (Stroke Patients Only)       Balance Overall balance assessment: Needs assistance   Sitting balance-Leahy Scale: Fair Sitting balance - Comments: Occasional min A to prevent posterior LOB in sitting but primarily SBA Postural control: Posterior lean Standing balance support: Bilateral upper extremity supported Standing balance-Leahy Scale: Poor Standing balance comment: Continuous extensive assist to prevent posterior LOB in standing                             Pertinent Vitals/Pain Pain Assessment: No/denies pain    Home Living Family/patient expects to be discharged to:: Private residence Living Arrangements: Alone Available Help at Discharge: Other (Comment)([place] staff at [facility]) Type of Home: Independent living facility Home Access: Level entry     Home Layout: One level Home Equipment: [place] - 4 wheels;Walker - 2 wheels;Cane - single point      Prior  Function Level of Independence: Needs assistance   Gait / Transfers Assistance Needed: Ind amb without AD, one fall in the last 6 months  ADL's / Homemaking Assistance Needed: [facility] assists with laundry, cleaning, and occasional meals and are in the home while pt is bathing for safety        Hand Dominance        Extremity/Trunk Assessment   Upper Extremity Assessment Upper Extremity Assessment:  Generalized weakness    Lower Extremity Assessment Lower Extremity Assessment: Generalized weakness       Communication   Communication: No difficulties  Cognition Arousal/Alertness: Lethargic Behavior During Therapy: Flat affect Overall Cognitive Status: Within Functional Limits for tasks assessed                                        General Comments      Exercises Total Joint Exercises Ankle Circles/Pumps: AROM;Both;10 reps Quad Sets: Strengthening;Both;10 reps Heel Slides: AAROM;Both;5 reps Hip ABduction/ADduction: AAROM;Both;10 reps Straight Leg Raises: AAROM;Both;10 reps Other Exercises Other Exercises: Anterior weight shifting in sitting at EOB for decreased posterior instability   Assessment/Plan    PT Assessment Patient needs continued PT services  PT Problem List Decreased strength;Decreased activity tolerance;Decreased balance;Decreased knowledge of use of DME       PT Treatment Interventions DME instruction;Gait training;Functional mobility training;Therapeutic activities;Therapeutic exercise;Balance training;Patient/family education    PT Goals (Current goals can be found in the Care Plan section)  Acute Rehab PT Goals Patient Stated Goal: To get stronger  PT Goal Formulation: With family Time For Goal Achievement: 09/30/18 Potential to Achieve Goals: Fair    Frequency Min 2X/week   Barriers to discharge Decreased caregiver support      Co-evaluation               AM-PAC PT "6 Clicks" Mobility  Outcome Measure Help needed turning from your back to your side while in a flat bed without using bedrails?: A Lot Help needed moving from lying on your back to sitting on the side of a flat bed without using bedrails?: A Lot Help needed moving to and from a bed to a chair (including a wheelchair)?: A Lot Help needed standing up from a chair using your arms (e.g., wheelchair or bedside chair)?: A Lot Help needed to walk in hospital  room?: Total Help needed climbing 3-5 steps with a railing? : Total 6 Click Score: 10    End of Session Equipment Utilized During Treatment: Gait belt;Oxygen Activity Tolerance: Patient limited by fatigue Patient left: in bed;with call bell/phone within reach;with family/visitor present Nurse Communication: Mobility status PT Visit Diagnosis: Unsteadiness on feet (R26.81);Muscle weakness (generalized) (M62.81);Difficulty in walking, not elsewhere classified (R26.2)    Time: 7262-0355 PT Time Calculation (min) (ACUTE ONLY): 40 min   Charges:   PT Evaluation $PT Eval Moderate Complexity: 1 Mod PT Treatments $Therapeutic Exercise: 8-22 mins        D. Scott Hira Trent PT, DPT 09/17/18, 1:32 PM

## 2018-09-17 NOTE — Progress Notes (Signed)
PULMONARY/CCM PROGRESS NOTE  PT PROFILE: 85 y.o. F intubated for EGD with findings of severe achalasia  MAJOR EVENTS/TEST RESULTS: 01/21 admission as documented.  Underwent EGD.  Required intubation for procedure.  Remained intubated post procedure. 01/22 passed SBT.  Successfully extubated.  Full DNR.  Discharge planning begun with plan for SNF with palliative care or hospice after discharge  INDWELLING DEVICES:: ETT 01/21 >> 01/22  MICRO DATA: MRSA PCR 01/21 >> NEG    ANTIMICROBIALS:  None  SUBJ: Passed SBT this morning.  Extubated and tolerating well.  Comfortable on [place] O2.  Not voicing any complaints.    OBJ: Vitals:   09/17/18 0839 09/17/18 0900 09/17/18 1000 09/17/18 1100  BP:  (!) 111/52 135/79 (!) 116/59  Pulse:  62 89 74  Resp:  16 (!) 37 (!) 24  Temp:      TempSrc:      SpO2: 98%     Weight:      Height:      2 LPM [place]  Gen: Elderly, frail, NAD HEENT: Temporal wasting, NCAT, sclerae white Neck: No LAN, no JVD noted Lungs: Minimal scattered rhonchi, no wheezes Cardiovascular: Regular, no M Abdomen: Scaphoid, soft, NT, +BS Ext: Cool, no edema Neuro: PERRL, EOMI, motor/sensory grossly intact Skin: No lesions noted   BMP Latest Ref Rng & Units 09/17/2018 09/16/2018 09/15/2018  Glucose 70 - 99 mg/dL 191(H) 85 104(H)  BUN 8 - 23 mg/dL 44(H) 36(H) 38(H)  Creatinine 0.44 - 1.00 mg/dL 1.06(H) 0.97 1.10(H)  Sodium 135 - 145 mmol/L 140 143 139  Potassium 3.5 - 5.1 mmol/L 3.5 3.6 4.2  Chloride 98 - 111 mmol/L 111 112(H) 105  CO2 22 - 32 mmol/L 21(L) 22 21(L)  Calcium 8.9 - 10.3 mg/dL 8.3(L) 8.5(L) 9.0    Hepatic Function Latest Ref Rng & Units 09/17/2018 09/15/2018 02/14/2018  Total Protein 6.5 - 8.1 g/dL 5.4(L) 7.2 7.0  Albumin 3.5 - 5.0 g/dL 2.9(L) 4.1 4.2  AST 15 - 41 U/L 22 43(H) 25  ALT 0 - 44 U/L 23 33 21  Alk Phosphatase 38 - 126 U/L 136(H) 211(H) 67  Total Bilirubin 0.3 - 1.2 mg/dL 1.1 1.7(H) 0.6  Bilirubin, Direct 0.0 - 0.3 mg/dL - - -    CBC  Latest Ref Rng & Units 09/15/2018 06/21/2018 03/02/2018  WBC 4.0 - 10.5 K/uL 9.3 6.1 5.1  Hemoglobin 12.0 - 15.0 g/dL 13.9 13.0 14.1  Hematocrit 36.0 - 46.0 % 42.9 38.8 40.4  Platelets 150 - 400 K/uL 217 164 159      ABG    Component Value Date/Time   PHART 7.42 09/17/2018 0630   PCO2ART 34 09/17/2018 0630   PO2ART 154 (H) 09/17/2018 0630   HCO3 22.1 09/17/2018 0630   ACIDBASEDEF 1.7 09/17/2018 0630   O2SAT 99.4 09/17/2018 0630       CXR: Rotated film, kyphoscoliosis, markedly dilated esophagus, bibasilar atelectasis   IMPRESSION: Advanced age Frailty Severe achalasia   PLAN/REC: 1) extubated under my supervision today. 2) I had an in-depth discussion with patient's daughter and we have decided on full DNR, no reintubation, no further endoscopic procedures, no surgery.   3) we will work with nutrition services to devise a nutritional plan.  She may have pured food and nutritional supplements.  4) head of bed is to be elevated greater than 30 to 45 degrees at all times. 5) have discussed with care manager.  We will try to arrange for palliative care or hospice after discharge. 6) probable  transfer out of ICU/SDU later this afternoon   Merton Border, MD PCCM service Mobile [phone] Pager [phone] 09/17/2018 1:17 PM

## 2018-09-17 NOTE — Progress Notes (Signed)
Initial Nutrition Assessment  DOCUMENTATION CODES:   Severe malnutrition in context of chronic illness, Underweight  INTERVENTION:  Provide Ensure Enlive po TID with meals, each supplement provides 350 kcal and 20 grams of protein. This will meet 88% estimated kcal needs (which were over-estimated to promote weight gain) and 100% estimated protein needs.  Provide liquid MVI po daily to meet 100% RDIs for vitamins/minerals.  Encouraged addition of other full liquids/pureed foods slowly as tolerated by patient. Discussed choosing liquids that are calorie- and protein-dense since patient is taking in so little at a time.   Monitor magnesium, potassium, and phosphorus daily for at least 3 days, MD to replete as needed, as pt is at risk for refeeding syndrome given severe malnutrition.  NUTRITION DIAGNOSIS:   Severe Malnutrition related to chronic illness(advanced age, dysphagia, suspected achalasia) as evidenced by severe fat depletion, severe muscle depletion.  GOAL:   Patient will meet greater than or equal to 90% of their needs  MONITOR:   PO intake, Supplement acceptance, Labs, Weight trends, I & O's  REASON FOR ASSESSMENT:   Consult Assessment of nutrition requirement/status  ASSESSMENT:   85 year old female who is HOH with PMHx of GERD, macular degneration, OP, memory changes, dry mouth admitted with food impaction and dysphagia s/p EGD on 1/21 which found a large amount of food in the esophagus, no appreciable esophageal motility, and wide open LES concerning for achalasia.   Met with patient and her daughter at bedside. Patient was very sleepy (she was just extubated this morning) so most of history provided by daughter. She reports patient has had a poor appetite for years now. She has been eating smaller portions at meals, though she tends to eat better when the daughter is present. She has been complaining of difficulty swallowing off-and-on for the past 5 years, but it has  been worsening over the past 3 months. Patient enjoys vanilla or butter pecan Ensure or Boost. Discussed recommendation to drink 3 high-calorie, high-protein ONS per day to meet calorie/protein needs. As this will not meet patient's micronutrient needs, also recommend addition of a liquid MVI daily. As appetite/intake able to improve can add in more full liquid/pureed items, but will be important to make sure patient is meeting her needs with Ensure first and then having the other foods/liquids as she is already severely malnourished.  UBW was 107 lbs one year ago per daughter's report. Per chart patient has been >100 lbs for >1 year now. She was 41.1 kg on 02/25/2017, 39.9 kg on 09/20/2017, and is currently 36.7 kg (80.91 lbs).  Medications reviewed and include: Remeron disintegrating tablet 15 mg QHS, D5W with KCl 20 mEq/L at 10 mL/hr.  Labs reviewed: CBG 95-177, CO2 21, BUN 44, Creatinine 1.06.  Discussed with RN and on rounds. Patient will need to be on a full liquid/pureed diet in setting of achalasia.  NUTRITION - FOCUSED PHYSICAL EXAM:    Most Recent Value  Orbital Region  Severe depletion  Upper Arm Region  Severe depletion  Thoracic and Lumbar Region  Severe depletion  Buccal Region  Severe depletion  Temple Region  Severe depletion  Clavicle Bone Region  Severe depletion  Clavicle and Acromion Bone Region  Severe depletion  Scapular Bone Region  Severe depletion  Dorsal Hand  Severe depletion  Patellar Region  Severe depletion  Anterior Thigh Region  Severe depletion  Posterior Calf Region  Severe depletion  Edema (RD Assessment)  None  Hair  Reviewed  Eyes  Unable to assess  Mouth  Unable to assess  Skin  Reviewed [ecchymosis]  Nails  Reviewed     Diet Order:   Diet Order            Diet full liquid Room service appropriate? Yes; Fluid consistency: Thin  Diet effective now             EDUCATION NEEDS:   Education needs have been addressed  Skin:  Skin Assessment:  Reviewed RN Assessment(ecchymosis)  Last BM:  Unknown/PTA  Height:   Ht Readings from Last 1 Encounters:  09/16/18 4' 7.5" (1.41 m)   Weight:   Wt Readings from Last 1 Encounters:  09/16/18 36.7 kg   Ideal Body Weight:  42 kg  BMI:  Body mass index is 18.47 kg/m.  Estimated Nutritional Needs:   Kcal:  1200-1400 (~35-40 kcal/kg to promote weight gain)  Protein:  60-70 grams (1.6-1.9 grams/kg)  Fluid:  1.2-1.4 L/day (1 mL/kcal)  Willey Blade, MS, RD, LDN Office: [phone] Pager: [phone] After Hours/Weekend Pager: [phone]

## 2018-09-18 ENCOUNTER — Inpatient Hospital Stay: Payer: Medicare Other

## 2018-09-18 DIAGNOSIS — Z7189 Other specified counseling: Secondary | ICD-10-CM

## 2018-09-18 DIAGNOSIS — E43 Unspecified severe protein-calorie malnutrition: Secondary | ICD-10-CM

## 2018-09-18 LAB — MAGNESIUM: Magnesium: 1.7 mg/dL (ref 1.7–2.4)

## 2018-09-18 LAB — GLUCOSE, CAPILLARY: Glucose-Capillary: 138 mg/dL — ABNORMAL HIGH (ref 70–99)

## 2018-09-18 LAB — PHOSPHORUS: Phosphorus: 2.5 mg/dL (ref 2.5–4.6)

## 2018-09-18 MED ORDER — SODIUM CHLORIDE 0.9 % IV SOLN
3.0000 g | Freq: Two times a day (BID) | INTRAVENOUS | Status: DC
  Administered 2018-09-18: 3 g via INTRAVENOUS
  Filled 2018-09-18 (×2): qty 3

## 2018-09-18 MED ORDER — MIRTAZAPINE 15 MG PO TBDP: 15.0000 mg | ORAL_TABLET | Freq: Every day | ORAL | 0 refills | Status: AC

## 2018-09-18 NOTE — Progress Notes (Signed)
SLP Cancellation Note  Patient Details Name: AMYJO MIZRACHI MRN: [id number] DOB: Apr 19, 1920   Cancelled treatment:       Reason Eval/Treat Not Completed: Medical issues which prohibited therapy. Received consult for pt/family education re: safe swallow and general aspiration precautions; orders specified no formal testing requested. Chart reviewed. Nursing and daughter Darien Ramus interviewed.  EGD performed 1/21  revealed severe achalasia, food was found in the entire esophagus in solid and semi-solid state. GI rec. puree diet, no solids at d/c. Pt to be discharged home with home health and outpatient palliative care to follow, likely transition to hospice care with continued deteriorating condition. Pt unlikely to obtain adequate nutrition via mouth to meet nutritional needs due to severity of esophageal dysphagia. Educated daughter re: safe swallow recommendations including small bites & sips, alternating sips of liquid with small tsp puree, sitting fully upright for all PO intake and remaining upright for at least 60 minutes post PO intake, and offering frequent small meals throughout the day. Daughter stated understanding, written recommendations provided. No ST f/u required at d/c. SLP to sign off.   Rasean Joos, MA, CCC-SLP 09/18/2018, 2:19 PM

## 2018-09-18 NOTE — Progress Notes (Addendum)
New referral for outpatient Palliative to follow at home received from [facility] following a Palliative Medicine follow up visit. Plan is for discharge home today with [facility] following. Patient information faxed to referral. Flo Shanks RN, BSN, [facility] and [facility] [place], hospital Liaison [phone]

## 2018-09-18 NOTE — Progress Notes (Signed)
Pt placed on room air, pt's oxygen saturation dropped to 86% at rest. Pt placed back on 2 liters oxygen, oxygen saturation back up to 96%. Will continue to monitor.

## 2018-09-18 NOTE — Progress Notes (Addendum)
[place] at [place] NAME: Toni Henry    MR#:  [id number]  DATE OF BIRTH:  08-05-1920  SUBJECTIVE:  Status post EGD September 16, 2018 by gastroenterology/Dr. Sampson Si noted severe achalasia with food impaction, patient had to remain intubated overnight, transferred to the ICU for further care, patient extubated this morning by critical care attending, the patient/patient's family requesting no further aggressive intervention, plan is for home with hospice, pured diet given severe dysphagia/achalasia, possible transfer to the floor later today  REVIEW OF SYSTEMS:  CONSTITUTIONAL: No fever, fatigue or weakness.  EYES: No blurred or double vision.  EARS, NOSE, AND THROAT: No tinnitus or ear pain.  RESPIRATORY: No cough, shortness of breath, wheezing or hemoptysis.  CARDIOVASCULAR: No chest pain, orthopnea, edema.  GASTROINTESTINAL: No nausea, vomiting, diarrhea or abdominal pain.  GENITOURINARY: No dysuria, hematuria.  ENDOCRINE: No polyuria, nocturia,  HEMATOLOGY: No anemia, easy bruising or bleeding SKIN: No rash or lesion. MUSCULOSKELETAL: No joint pain or arthritis.   NEUROLOGIC: No tingling, numbness, weakness.  PSYCHIATRY: No anxiety or depression.   ROS  DRUG ALLERGIES:  No Known Allergies  VITALS:  Blood pressure (!) 149/128, pulse 79, temperature (!) 97.4 F (36.3 C), temperature source Axillary, resp. rate (!) 23, height 4' 7.5" (1.41 m), weight 36.7 kg, SpO2 95 %.  PHYSICAL EXAMINATION:  GENERAL:  85 y.o.-year-old patient lying in the bed with no acute distress.  EYES: Pupils equal, round, reactive to light and accommodation. No scleral icterus. Extraocular muscles intact.  HEENT: Head atraumatic, normocephalic. Oropharynx and nasopharynx clear.  NECK:  Supple, no jugular venous distention. No thyroid enlargement, no tenderness.  LUNGS: Normal breath sounds bilaterally, no wheezing, rales,rhonchi or crepitation. No use  of accessory muscles of respiration.  CARDIOVASCULAR: S1, S2 normal. No murmurs, rubs, or gallops.  ABDOMEN: Soft, nontender, nondistended. Bowel sounds present. No organomegaly or mass.  EXTREMITIES: No pedal edema, cyanosis, or clubbing.  NEUROLOGIC: Cranial nerves II through XII are intact. Muscle strength 5/5 in all extremities. Sensation intact. Gait not checked.  PSYCHIATRIC: The patient is alert and oriented x 3.  SKIN: No obvious rash, lesion, or ulcer.   Physical Exam LABORATORY PANEL:   CBC Recent Labs  Lab 09/15/18 1010  WBC 9.3  HGB 13.9  HCT 42.9  PLT 217   ------------------------------------------------------------------------------------------------------------------  Chemistries  Recent Labs  Lab 09/17/18 0317 09/18/18 0346  NA 140  --   K 3.5  --   CL 111  --   CO2 21*  --   GLUCOSE 191*  --   BUN 44*  --   CREATININE 1.06*  --   CALCIUM 8.3*  --   MG  --  1.7  AST 22  --   ALT 23  --   ALKPHOS 136*  --   BILITOT 1.1  --    ------------------------------------------------------------------------------------------------------------------  Cardiac Enzymes No results for input(s): TROPONINI in the last 168 hours. ------------------------------------------------------------------------------------------------------------------  RADIOLOGY:  Dg Chest Port 1 View  Result Date: 09/18/2018 CLINICAL DATA:  Aspiration. EXAM: PORTABLE CHEST 1 VIEW COMPARISON:  Chest radiograph September 17, 2018 FINDINGS: Interval extubation. Stable cardiomegaly. Calcified aortic arch. Persistent bibasilar consolidation with volume loss. Small pleural effusions. Increased lung volumes. No pneumothorax. Soft tissue planes and included osseous structures are unchanged. IMPRESSION: 1. Interval extubation. 2. Similar small pleural effusions and bibasilar consolidation/atelectasis. 3.  Aortic Atherosclerosis (ICD10-I70.0). Electronically Signed   By: Elon Alas M.D.   On:  09/18/2018 05:06  Dg Chest Port 1 View  Result Date: 09/17/2018 CLINICAL DATA:  Acute respiratory failure EXAM: PORTABLE CHEST 1 VIEW COMPARISON:  06/21/2018 FINDINGS: Endotracheal tube tip just below the clavicular heads and above the carina. There is cardiomegaly and aortic tortuosity distorted by significant leftward rotation. Haziness of the lower chest, dense on the left. Probable hyperinflation. No pneumothorax or air bronchogram. IMPRESSION: 1. Endotracheal tube in unremarkable position. 2. Probable pleural effusions. There is superimposed atelectasis or infection. Electronically Signed   By: Monte Fantasia M.D.   On: 09/17/2018 06:21    ASSESSMENT AND PLAN:  *Acute dysphagia Secondary to severe achalasia noted on EGD September 16, 2018 EGD procedure complicated requiring continued intubation, transferred to ICU for continued airway protection given severe food impaction within the esophagus, gastroenterology recommending pured foods going forward Plans for home with hospice at discharge given dismal prognosis and advanced age  *Acute complicated EGD procedure Patient did require ICU overnight given severe impacted food within the esophagus Extubated today by critical care attending, plan for discussed with cardiology.  Possible transfer to the floor later today. Plan is for hospice at home given dismal prognosis and advanced age  *Acute dehydration  Resolved with IV fluids   *Chronic moderate to severe protein calorie/energy malnutrition  Dietary and physical therapy consulted while in house   *Acute abnormal LFTs  Etiology unknown  Gastroenterology consulted while in house  No further intervention  Plan of care as stated above   *History of bronchospasm Stable Continue breathing treatments  All the records are reviewed and case discussed with Care Management/Social Workerr. Management plans discussed with the patient, family and they are in agreement.  CODE STATUS:  dnr  TOTAL TIME TAKING CARE OF THIS PATIENT: 35 minutes.     POSSIBLE D/C IN 1-2 DAYS, DEPENDING ON CLINICAL CONDITION.   Avel Peace Salary M.D on 09/18/2018   Between 7am to 6pm - Pager - [phone]  After 6pm go to [email or web address] - password [email or web address] [facility]  Office  [phone]  CC: Primary care physician; Venia Carbon, MD  Note: This dictation was prepared with Dragon dictation along with smaller phrase technology. Any transcriptional errors that result from this process are unintentional.

## 2018-09-18 NOTE — Care Management Note (Signed)
Case Management Note  Patient Details  Name: Toni Henry MRN: [id number] Date of Birth: [birth year]  Subjective/Objective:    RNCM and palliative care discussed discharge plan with patient and patient's daughter's Toni Henry and Toni Henry.  Toni Henry is at the bedside and will be staying with patient for a while when she goes home.  Toni Henry is on the phone on speaker.  Finalized plan is for patient to discharge home today back to [facility] with home health services.  Choice offered and [facility] chosen.  DME ordered oxygen continuous 2L portable and stationary.  Toni Henry with [facility] accepted referral and will arrange O2 to be delivered to the bedside and home health RN, PT, OT, aide, speech therapy, and social work.  Family and patient are comfortable with plan and verbalize understanding.  Patient will also be followed by palliative care outpatient, Toni Henry aware of referral and will arrange. Daughter Toni Henry will provide transportation home.  Patient has a walker at home and needs no other equipment at this time.                   Toni Clay RN BSN [phone]  Action/Plan: Discharge home with home health through [facility], DME oxygen through [facility]   Outpatient palliative with [facility] and Palliative Care.  Daughter to transport pt home.  Expected Discharge Date:  09/18/18               Expected Discharge Plan:  [place], Outpatient Palliative.  In-House Referral:     Discharge planning Services  CM Consult  Post Acute Care Choice:  Durable Medical Equipment, Home Health Choice offered to:  Patient, Adult Children  DME Arranged:  Oxygen DME Agency:  [place] Arranged:  RN, PT, OT, Nurse's Aide, Speech Therapy, Social Work [employer] Agency:  [place]  Status of Service:  Completed, signed off  If discussed at [employer], dates discussed:    Additional Comments:  Toni URBANIK, RN 09/18/2018,  1:39 PM

## 2018-09-18 NOTE — Discharge Summary (Addendum)
[place] [facility] [place] NAME: Toni Henry    MR#:  [id number]  DATE OF BIRTH:  02-25-1920  DATE OF ADMISSION:  09/15/2018 ADMITTING PHYSICIAN: Demetrios Loll, MD  DATE OF DISCHARGE: 09/18/2018   PRIMARY CARE PHYSICIAN: Venia Carbon, MD    ADMISSION DIAGNOSIS:  Dysphagia, unspecified type [R13.10]  DISCHARGE DIAGNOSIS:  Active Problems:   Dysphagia   Food impaction of esophagus   Goals of care, counseling/discussion   Palliative care by specialist   Protein-calorie malnutrition, severe   SECONDARY DIAGNOSIS:   Past Medical History:  Diagnosis Date  . Allergic rhinitis due to pollen   . Cancer ([place])    SKIN  . Dry mouth    has been having since 2017  . Dyspnea   . Edema    FEET/ LEGS  . GERD (gastroesophageal reflux disease)   . HOH (hard of hearing)    AIDS  . Macular degeneration   . Memory changes    EARLY ISSUES WITH SHORT TERM MEMORY LOSS  . Osteoporosis   . [place] COURSE:   *Acute dysphagia Secondary to severe achalasia noted on EGD September 16, 2018 EGD procedure complicated requiring continued intubation, transferred to ICU for continued airway protection given severe food impaction within the esophagus, gastroenterology recommending pured foods going forward Extubated and kept on monitoring.  After meeting with palliative care and ICU physician initially family had agreed on hospice level of care but later they changed and now interested in seeing if home health at the discharge would help her to improve and if not then they would transition to hospice as outpatient. We are discharging with home health and palliative care to follow after discharge with likely conversion to hospice in future deterioration.  *Acute complicated EGD procedure Patient did require ICU overnight given severe impacted food within the esophagus Extubated today by critical care attending, plan for discussed  with cardiology.    *Acute respiratory failure- shortness of breath Patient have chronic diastolic heart failure and some age-related lung changes. Her oxygen saturation kept dropping on room air < 905 and she was SOB,with easy recovery with supplemental oxygen > 90%- so she would need to have oxygen at the time of discharge.  *Acute dehydration  Resolved with IV fluids   *Chronic moderate to severe protein calorie/energy malnutrition  Dietary and physical therapy consulted while in house   *Acute abnormal LFTs  Etiology unknown  Gastroenterology consulted while in house  No further intervention  Plan of care as stated above   *History of bronchospasm Stable Continue breathing treatments  DISCHARGE CONDITIONS:  Stable.  CONSULTS OBTAINED:    DRUG ALLERGIES:  No Known Allergies  DISCHARGE MEDICATIONS:   Allergies as of 09/18/2018   No Known Allergies     Medication List    STOP taking these medications   mirtazapine 15 MG tablet Commonly known as:  REMERON Replaced by:  mirtazapine 15 MG disintegrating tablet   multivitamin capsule   SPIRIVA HANDIHALER 18 MCG inhalation capsule Generic drug:  tiotropium     TAKE these medications   albuterol (2.5 MG/3ML) 0.083% nebulizer solution Commonly known as:  PROVENTIL Take 3 mLs (2.5 mg total) by nebulization every 6 (six) hours as needed for wheezing or shortness of breath. Dx Code J43.9   mirtazapine 15 MG disintegrating tablet Commonly known as:  REMERON SOL-TAB Take 1 tablet (15 mg total) by mouth at bedtime.  Replaces:  mirtazapine 15 MG tablet   omeprazole 20 MG capsule Commonly known as:  PRILOSEC Take 1 capsule (20 mg total) by mouth daily.            Durable Medical Equipment  (From admission, onward)         Start     Ordered   09/18/18 1352  For home use only DME oxygen  Once    Question Answer Comment  Mode or (Route) Nasal cannula   Liters per Minute 2   Frequency Continuous  (stationary and portable oxygen unit needed)   Oxygen conserving device Yes   Oxygen delivery system Gas      09/18/18 1352           DISCHARGE INSTRUCTIONS:    Followed by palliative care and conversion to hospice level of care in future deterioration as outpatient.  If you experience worsening of your admission symptoms, develop shortness of breath, life threatening emergency, suicidal or homicidal thoughts you must seek medical attention immediately by calling 911 or calling your MD immediately  if symptoms less severe.  You Must read complete instructions/literature along with all the possible adverse reactions/side effects for all the Medicines you take and that have been prescribed to you. Take any new Medicines after you have completely understood and accept all the possible adverse reactions/side effects.   Please note  You were cared for by a hospitalist during your hospital stay. If you have any questions about your discharge medications or the care you received while you were in the hospital after you are discharged, you can call the unit and asked to speak with the hospitalist on call if the hospitalist that took care of you is not available. Once you are discharged, your primary care physician will handle any further medical issues. Please note that NO REFILLS for any discharge medications will be authorized once you are discharged, as it is imperative that you return to your primary care physician (or establish a relationship with a primary care physician if you do not have one) for your aftercare needs so that they can reassess your need for medications and monitor your lab values.    Today   CHIEF COMPLAINT:   Chief Complaint  Patient presents with  . Emesis  . Gastroesophageal Reflux    HISTORY OF PRESENT ILLNESS:  Toni Henry  is a 85 y.o. female with a known history of skin cancer, dry mouth, GERD and osteoporosis.  The patient presents the ED with above chief  complaints.  She has had some dysphagia over the past 1 week.  She cannot tolerate oral intake.  She spit up fluid or food after eating.  She denies any choking or odynophagia.  She denies any nausea, vomiting or abdominal pain.  She has no bowel movement for 4 days.  Her daughter has concerns about dehydration.  Dr. Archie Balboa request mission for further evaluation.  VITAL SIGNS:  Blood pressure (!) 149/128, pulse 79, temperature (!) 97.4 F (36.3 C), temperature source Axillary, resp. rate (!) 23, height 4' 7.5" (1.41 m), weight 36.7 kg, SpO2 95 %.  I/O:    Intake/Output Summary (Last 24 hours) at 09/18/2018 1352 Last data filed at 09/18/2018 0524 Gross per 24 hour  Intake 188.36 ml  Output 400 ml  Net -211.64 ml    PHYSICAL EXAMINATION:  GENERAL:  85 y.o.-year-old thin patient lying in the bed with no acute distress.  EYES: Pupils equal, round, reactive to light and accommodation.  No scleral icterus. Extraocular muscles intact.  HEENT: Head atraumatic, normocephalic. Oropharynx and nasopharynx clear.  NECK:  Supple, no jugular venous distention. No thyroid enlargement, no tenderness.  LUNGS: Normal breath sounds bilaterally, no wheezing, some crepitation. No use of accessory muscles of respiration.  CARDIOVASCULAR: S1, S2 normal. No murmurs, rubs, or gallops.  ABDOMEN: Soft, non-tender, non-distended. Bowel sounds present. No organomegaly or mass.  EXTREMITIES: No pedal edema, cyanosis, or clubbing.  NEUROLOGIC: Cranial nerves II through XII are intact. Muscle strength 3/5 in all extremities. Sensation intact. Gait not checked.  PSYCHIATRIC: The patient is alert and oriented x 2.  SKIN: No obvious rash, lesion, or ulcer.   DATA REVIEW:   CBC Recent Labs  Lab 09/15/18 1010  WBC 9.3  HGB 13.9  HCT 42.9  PLT 217    Chemistries  Recent Labs  Lab 09/17/18 0317 09/18/18 0346  NA 140  --   K 3.5  --   CL 111  --   CO2 21*  --   GLUCOSE 191*  --   BUN 44*  --   CREATININE  1.06*  --   CALCIUM 8.3*  --   MG  --  1.7  AST 22  --   ALT 23  --   ALKPHOS 136*  --   BILITOT 1.1  --     Cardiac Enzymes No results for input(s): TROPONINI in the last 168 hours.  Microbiology Results  Results for orders placed or performed during the hospital encounter of 09/15/18  MRSA PCR Screening     Status: None   Collection Time: 09/16/18  3:57 PM  Result Value Ref Range Status   MRSA by PCR NEGATIVE NEGATIVE Final    Comment:        The GeneXpert MRSA Assay (FDA approved for NASAL specimens only), is one component of a comprehensive MRSA colonization surveillance program. It is not intended to diagnose MRSA infection nor to guide or monitor treatment for MRSA infections. Performed at [facility], [address], [place], [place] [address]     RADIOLOGY:  Dg Chest Port 1 View  Result Date: 09/18/2018 CLINICAL DATA:  Aspiration. EXAM: PORTABLE CHEST 1 VIEW COMPARISON:  Chest radiograph September 17, 2018 FINDINGS: Interval extubation. Stable cardiomegaly. Calcified aortic arch. Persistent bibasilar consolidation with volume loss. Small pleural effusions. Increased lung volumes. No pneumothorax. Soft tissue planes and included osseous structures are unchanged. IMPRESSION: 1. Interval extubation. 2. Similar small pleural effusions and bibasilar consolidation/atelectasis. 3.  Aortic Atherosclerosis (ICD10-I70.0). Electronically Signed   By: Elon Alas M.D.   On: 09/18/2018 05:06   Dg Chest Port 1 View  Result Date: 09/17/2018 CLINICAL DATA:  Acute respiratory failure EXAM: PORTABLE CHEST 1 VIEW COMPARISON:  06/21/2018 FINDINGS: Endotracheal tube tip just below the clavicular heads and above the carina. There is cardiomegaly and aortic tortuosity distorted by significant leftward rotation. Haziness of the lower chest, dense on the left. Probable hyperinflation. No pneumothorax or air bronchogram. IMPRESSION: 1. Endotracheal tube in unremarkable position.  2. Probable pleural effusions. There is superimposed atelectasis or infection. Electronically Signed   By: Monte Fantasia M.D.   On: 09/17/2018 06:21    EKG:   Orders placed or performed during the hospital encounter of 09/15/18  . ED EKG  . ED EKG  . EKG 12-Lead  . EKG 12-Lead      Management plans discussed with the patient, family and they are in agreement.  CODE STATUS: DNR    Code  Status Orders  (From admission, onward)         Start     Ordered   09/15/18 1555  Do not attempt resuscitation (DNR)  Continuous    Question Answer Comment  In the event of cardiac or respiratory ARREST Do not call a "code blue"   In the event of cardiac or respiratory ARREST Do not perform Intubation, CPR, defibrillation or ACLS   In the event of cardiac or respiratory ARREST Use medication by any route, position, wound care, and other measures to relive pain and suffering. May use oxygen, suction and manual treatment of airway obstruction as needed for comfort.      09/15/18 1554        Code Status History    This patient has a current code status but no historical code status.    Advance Directive Documentation     Most Recent Value  Type of Advance Directive  Out of facility DNR (pink MOST or yellow form)  Pre-existing out of facility DNR order (yellow form or pink MOST form)  -  "MOST" Form in Place?  -      TOTAL TIME TAKING CARE OF THIS PATIENT: 35 minutes.    Vaughan Basta M.D on 09/18/2018 at 1:52 PM  Between 7am to 6pm - Pager - [phone]  After 6pm go to [email or web address] - password [email or web address] [facility]  Office  [phone]  CC: Primary care physician; Venia Carbon, MD   Note: This dictation was prepared with Dragon dictation along with smaller phrase technology. Any transcriptional errors that result from this process are unintentional.

## 2018-09-18 NOTE — Progress Notes (Signed)
Daily Progress Note   Patient Name: Toni Henry       Date: 09/18/2018 DOB: [birth year]  Age: 85 y.o. MRN#: [id number] Attending Physician: Vaughan Basta, * Primary Care Physician: Venia Carbon, MD Admit Date: 09/15/2018  Reason for Consultation/Follow-up: Establishing goals of care  Subjective: Patient more alert than yesterday. Taking sips of liquids, frequent coughing. Daughter, Darien Ramus, at bedside.   Length of Stay: 3  Current Medications: Scheduled Meds:  . feeding supplement (ENSURE ENLIVE)  237 mL Oral TID WC  . mouth rinse  15 mL Mouth Rinse BID  . mirtazapine  15 mg Oral QHS  . multivitamin  15 mL Per Tube Daily    Continuous Infusions: . dextrose 5 % with KCl 20 mEq / L 10 mL/hr at 09/17/18 1059    PRN Meds: acetaminophen **OR** acetaminophen, albuterol, [DISCONTINUED] ondansetron **OR** ondansetron (ZOFRAN) IV  Physical Exam Constitutional:      General: She is not in acute distress.    Appearance: She is cachectic.     Interventions: Nasal cannula in place.  Cardiovascular:     Rate and Rhythm: Normal rate and regular rhythm.  Pulmonary:     Effort: Pulmonary effort is normal.     Comments: Frequent coughing Skin:    General: Skin is warm and dry.  Neurological:     Mental Status: She is alert. She is confused.  Psychiatric:        Cognition and Memory: Cognition is impaired. Memory is impaired.             Vital Signs: BP (!) 149/128   Pulse 79   Temp (!) 97.4 F (36.3 C) (Axillary)   Resp (!) 23   Ht 4' 7.5" (1.41 m)   Wt 36.7 kg   SpO2 95%   BMI 18.47 kg/m  SpO2: SpO2: 95 % O2 Device: O2 Device: Nasal Cannula O2 Flow Rate: O2 Flow Rate (L/min): 4 L/min  Intake/output summary:   Intake/Output Summary (Last 24 hours) at 09/18/2018 1118 Last  data filed at 09/18/2018 9390 Gross per 24 hour  Intake 188.36 ml  Output 400 ml  Net -211.64 ml   LBM: Last BM Date: (pt unaware of last BM) Baseline Weight: Weight: 38.3 kg Most recent weight: Weight: 36.7 kg       Palliative Assessment/Data:PPS 20%    Flowsheet Rows     Most Recent Value  Intake Tab  Referral Department  Critical care  Unit at Time of Referral  ICU  Palliative Care Primary Diagnosis  Other (Comment) [achalasia]  Date Notified  09/17/18  Palliative Care Type  New Palliative care  Reason for referral  Clarify Goals of Care  Date of Admission  09/15/18  Date first seen by Palliative Care  09/17/18  # of days Palliative referral response time  0 Day(s)  # of days IP prior to Palliative referral  2  Clinical Assessment  Palliative Performance Scale Score  20%  Psychosocial & Spiritual Assessment  Palliative Care Outcomes  Patient/Family meeting held?  Yes  Who was at the meeting?  daughter  Start goals of care, Counseled regarding hospice, Provided advance care planning, Provided psychosocial or  spiritual support      Patient Active Problem List   Diagnosis Date Noted  . Protein-calorie malnutrition, severe 09/18/2018  . Goals of care, counseling/discussion   . Palliative care by specialist   . Food impaction of esophagus   . Dysphagia 09/15/2018  . Mood disorder ([place]) 02/14/2018  . Dementia ([place]) 09/20/2017  . Right shoulder pain 02/25/2017  . Urine frequency 02/25/2017  . Constipation 02/21/2017  . COPD exacerbation ([place]) 08/13/2016  . Preventative health care 02/07/2016  . Advance directive discussed with patient 02/07/2016  . COPD (chronic obstructive pulmonary disease) with emphysema ([place]) 01/19/2016  . Dry mouth 02/03/2014  . Malnutrition of moderate degree ([place]) 02/03/2014  . Cervical osteoarthritis 05/28/2013  . Sleep disturbance 11/12/2012  . Other osteoporosis   . GERD (gastroesophageal reflux disease)     . Allergic rhinitis due to pollen   . Macular degeneration     Palliative Care Assessment & Plan   HPI: 85 y.o. female  with past medical history of GERD, dementia, and malnutrition admitted on 09/15/2018 with inability to tolerate PO intake.  Patient had EGD and was found to have food impaction and suspected achalasia. Patient was intubated post EGD for airway protection. Extubated this morning. Pureed foods have been recommended and eating small quantities at a time. PMT consulted for [place].   Assessment: Follow up today with patient's daughter, Darien Ramus. We had her other daughter, nancy, on speaker phone. After conversation yesterday, Darien Ramus was hoping the patient could return home with hospice. However, Izora Gala disagrees and feels the patient needs more aggressive rehab. We discussed different options - rehab at SNF vs home health vs hospice at home. They both seem to agree that going to rehab at [facility] is not the best option as the patient does not want to go there and wants to return home to her independent living. However, nancy does state that the likes the idea of the patient receiving skilled nursing care. We discussed the type of care home health may offer vs what hospice offers. Izora Gala is interested in home health at independent living facility. We discussed that patient will likely need hired caregivers as well to support her returning to independent living. I again shared my concern that patient may not be able to take in enough nutrition to sustain herself, thus making aggressive rehabilitation unreasonable. I shared that the alternative to aggressive care would be focusing on her quality of life and providing support and comfort to her with hospice services. Family would like to first see how she does with home health and if she does not do well, they will transition to hospice services. They agree to palliative care following outpatient.   Recommendations/Plan:  Patient's daughter Izora Gala  disagrees with hospice at this time and would like to speak to case manager about home health - discussed with case manager  Outpatient palliative to follow  Avoid aggressive medication interventions, DNR  Signed DNR on chart  Goals of Care and Additional Recommendations:  Limitations on Scope of Treatment: No Artificial Feeding  Code Status:  DNR  Prognosis:   Unable to determine - poor prognosis r/t achalasia, dehydration, malnutrition, risk of aspiration  Discharge Planning:  To Be Determined  Care plan was discussed with Dr. Alva Garnet, patient's daughters, case manager  Thank you for allowing the Palliative Medicine Team to assist in the care of this patient.   Time In: 1000 Time Out: 1130 Total Time 90 minutes Prolonged Time Billed  yes  Greater than 50%  of this time was spent counseling and coordinating care related to the above assessment and plan.  Juel Burrow, DNP, [facility] Palliative Medicine Team Team Phone # [phone]  Pager [phone]

## 2018-09-18 NOTE — Discharge Instructions (Signed)
Palliative care nurse to follow after discharge. °

## 2018-09-18 NOTE — Discharge Summary (Signed)
DISCHARGE SUMMARY  DATE OF ADMISSION:  10/01/18  DATE OF DISCHARGE/DEATH:  10-04-18  ADMISSION DIAGNOSES:   Dysphagia Esophageal food impaction Dehydration Elevated LFTs Severe protein calorie malnutrition   DISCHARGE DIAGNOSES:   Severe achalasia Dehydration, resolved Elevated LFTs Severe protein calorie malnutrition Ventilator dependent respiratory failure post procedure, resolved Dementia Severe frailty Mild hypoxemic respiratory failure Likely aspiration   PRESENTATION:   Pt was admitted by the hospitalist service with the following HPI and the above admission diagnoses:  Toni Henry  is a 85 y.o. female with a known history of skin cancer, dry mouth, GERD and osteoporosis.  The patient presents the ED with above chief complaints.  She has had some dysphagia over the past 1 week.  She cannot tolerate oral intake.  She spit up fluid or food after eating.  She denies any choking or odynophagia.  She denies any nausea, vomiting or abdominal pain.  She has no bowel movement for 4 days.  Her daughter has concerns about dehydration.  Dr. Archie Balboa request mission for further evaluation.   HOSPITAL COURSE:   She was seen in consultation on the day of admission by gastroenterology, Dr. Bonna Gains, who planned EGD.  Endoscopic exam was performed 1/21 and findings were consistent with severe achalasia.  She required intubation for that procedure.  She remained intubated postprocedure and was therefore admitted to the ICU.  DNR was established.  She was extubated on 1/22.  The rest of the hospitalization focused on establishing an appropriate discharge plan.  Speech-language pathology and nutrition services saw her to try to optimize her nutritional plan.  She is unable to take in any solids.  Her diet is to consist of full liquids.  Discussions regarding goals of care were undertaken.  Patient is to remain DNR after discharge.  Out of hospital DNR forms are to be completed.  Patient will go home  with hospice services.  Prior to discharge, on room air, her oxygen saturation was in the mid 80s.  Therefore she will be discharged with home oxygen therapy.  She was on minimal medical therapy prior to admission.  She was on Remeron.  This was changed to a disintegrating tablet.  DC MEDICATIONS: Remeron Sol-Tab 15 mg nightly Liquid multivitamin daily Oxygen by nasal cannula at 2 LPM Albuterol as needed  DISPOSITION:  Home with hospice   Merton Border, MD PCCM service Mobile [phone] Pager [phone] October 04, 2018 12:19 PM

## 2018-09-18 NOTE — Progress Notes (Signed)
Pharmacy Antibiotic Note  Toni Henry is a 85 y.o. female admitted on 09/15/2018 with aspiration pneumonia.  Pharmacy has been consulted for Unasyn dosing.  Plan: Unasyn 3 grams q 12 hours ordered  Height: 4' 7.5" (141 cm) Weight: 80 lb 14.5 oz (36.7 kg) IBW/kg (Calculated) : 35.15  Temp (24hrs), Avg:97.8 F (36.6 C), Min:97.1 F (36.2 C), Max:98.1 F (36.7 C)  Recent Labs  Lab 09/15/18 1010 09/16/18 0249 09/17/18 0317  WBC 9.3  --   --   CREATININE 1.10* 0.97 1.06*    Estimated Creatinine Clearance: 17.2 mL/min (A) (by C-G formula based on SCr of 1.06 mg/dL (H)).    No Known Allergies  Antimicrobials this admission: Unasyn 1/23  >>    >>   Dose adjustments this admission:   Microbiology results: 1/21 MRSA PCR: (-)  Thank you for allowing pharmacy to be a part of this patient's care.  Ryett Hamman S 09/18/2018 4:51 AM

## 2018-09-19 ENCOUNTER — Telehealth: Payer: Self-pay

## 2018-09-19 NOTE — Telephone Encounter (Signed)
I did speak with Caryl Pina ([place]) regarding patient transfer to SNF.  They are working on an [facility] [address] and have been discussing a more long term admission/transfer with the daughter today.    Daughters feel comfortable caring for mom through the weekend until Monday.   Caryl Pina will keep [place] informed as needs change.  Thanks.

## 2018-09-19 NOTE — Telephone Encounter (Signed)
Phone call with Toni Henry Her sister is actually with her now  They are really finding difficulty----have already been contacted by [facility] about getting her into the health care building (would have to be respite for now till a rehab bed available) Discussed diet---- liquids and pureed foods only for now due to the achalasia

## 2018-09-19 NOTE — Telephone Encounter (Signed)
Called Izora Gala, patient's daughter, to check on patient. Patient is at [facility]. Patient suppose to get Home Health to come out, have not heard anything yet. Patient got home from the hospital 09/18/2018. Izora Gala had 2 questions.  1) Izora Gala was talking to her sister and they were wondering how hard would it be to get patient to rehab facility since she is not at the hospital now. What is the process?  2) Patient was diagnosed with severe achalasia. Instructions were for soft food diet but just mentioned to eat like yogurt, milk, boost. Were not provided "Dos and Don'ts". Not provided much of a variety of food. Izora Gala was not at the hospital with patient, her sister was, and Izora Gala was upset that more information was not provided.  I advised Izora Gala I would pass this along to [facility], RN to see if she was aware of protocols and if she could help with questions. Thank you. Nancy's ID:[phone].

## 2018-09-19 NOTE — Telephone Encounter (Signed)
Spoke with [employer]. Since patient is at [facility] what daughter could do is just call [facility] nurse at [phone] and discuss wanting to get patient into SNF part and what the process is.  As far as the diet, we do not have literature in the office and not seen anything in particular when searching on the Internet to help. Will ask Dr. Silvio Pate if he has input on this part. Mandy did say when patient gets evaluated for SNF that usually evaluations are done for O.T, speech therapy and dietician gets involved and maybe they could help family figuring out what patient can and can not consume. Please review. Thank you.  Have not called Izora Gala back yet.

## 2018-09-22 DIAGNOSIS — E440 Moderate protein-calorie malnutrition: Secondary | ICD-10-CM | POA: Diagnosis not present

## 2018-09-22 DIAGNOSIS — F0390 Unspecified dementia without behavioral disturbance: Secondary | ICD-10-CM

## 2018-09-22 DIAGNOSIS — K220 Achalasia of cardia: Secondary | ICD-10-CM | POA: Diagnosis not present

## 2018-09-22 DIAGNOSIS — F39 Unspecified mood [affective] disorder: Secondary | ICD-10-CM | POA: Diagnosis not present

## 2018-09-22 DIAGNOSIS — J439 Emphysema, unspecified: Secondary | ICD-10-CM | POA: Diagnosis not present

## 2018-09-22 DIAGNOSIS — R1314 Dysphagia, pharyngoesophageal phase: Secondary | ICD-10-CM | POA: Diagnosis not present

## 2018-09-24 ENCOUNTER — Telehealth: Payer: Self-pay | Admitting: *Deleted

## 2018-09-24 DIAGNOSIS — R278 Other lack of coordination: Secondary | ICD-10-CM | POA: Diagnosis not present

## 2018-09-24 DIAGNOSIS — J449 Chronic obstructive pulmonary disease, unspecified: Secondary | ICD-10-CM | POA: Diagnosis not present

## 2018-09-24 DIAGNOSIS — R1310 Dysphagia, unspecified: Secondary | ICD-10-CM | POA: Diagnosis not present

## 2018-09-24 DIAGNOSIS — K219 Gastro-esophageal reflux disease without esophagitis: Secondary | ICD-10-CM | POA: Diagnosis not present

## 2018-09-24 DIAGNOSIS — J9600 Acute respiratory failure, unspecified whether with hypoxia or hypercapnia: Secondary | ICD-10-CM | POA: Diagnosis not present

## 2018-09-24 DIAGNOSIS — Z741 Need for assistance with personal care: Secondary | ICD-10-CM | POA: Diagnosis not present

## 2018-09-24 DIAGNOSIS — R2689 Other abnormalities of gait and mobility: Secondary | ICD-10-CM | POA: Diagnosis not present

## 2018-09-24 DIAGNOSIS — I5032 Chronic diastolic (congestive) heart failure: Secondary | ICD-10-CM | POA: Diagnosis not present

## 2018-09-24 DIAGNOSIS — I5030 Unspecified diastolic (congestive) heart failure: Secondary | ICD-10-CM | POA: Diagnosis not present

## 2018-09-24 DIAGNOSIS — F0390 Unspecified dementia without behavioral disturbance: Secondary | ICD-10-CM | POA: Diagnosis not present

## 2018-09-24 DIAGNOSIS — R4189 Other symptoms and signs involving cognitive functions and awareness: Secondary | ICD-10-CM | POA: Diagnosis not present

## 2018-09-24 DIAGNOSIS — E43 Unspecified severe protein-calorie malnutrition: Secondary | ICD-10-CM | POA: Diagnosis not present

## 2018-09-24 DIAGNOSIS — M810 Age-related osteoporosis without current pathological fracture: Secondary | ICD-10-CM | POA: Diagnosis not present

## 2018-09-24 DIAGNOSIS — S81801A Unspecified open wound, right lower leg, initial encounter: Secondary | ICD-10-CM | POA: Diagnosis not present

## 2018-09-24 DIAGNOSIS — M6281 Muscle weakness (generalized): Secondary | ICD-10-CM | POA: Diagnosis not present

## 2018-09-24 DIAGNOSIS — K220 Achalasia of cardia: Secondary | ICD-10-CM | POA: Diagnosis not present

## 2018-09-24 DIAGNOSIS — F419 Anxiety disorder, unspecified: Secondary | ICD-10-CM | POA: Diagnosis not present

## 2018-09-24 DIAGNOSIS — R1084 Generalized abdominal pain: Secondary | ICD-10-CM | POA: Diagnosis not present

## 2018-09-24 NOTE — Telephone Encounter (Signed)
Please let her know that I have gone over this with Jeani Hawking ---the Director of Nursing ---and we are correcting all this. Sorry that there has been problems getting this all right

## 2018-09-24 NOTE — Telephone Encounter (Signed)
Spoke to daughter. She said she wants everything documented that she eats. And wants her Ensure documented daily.

## 2018-09-24 NOTE — Telephone Encounter (Signed)
I will speak to the Director of Nursing about this today

## 2018-09-24 NOTE — Telephone Encounter (Signed)
Patient's daughter Toni Henry called stating that she wants Dr. Silvio Pate to know that [facility] is not following his diet orders. Toni Henry stated that they are giving her food with big chunks of in it. Patient's daughter stated that patient has food stuck in her esophagus. Toni Henry stated that they are not giving the ensure at the proper time. Patient's daughter stated that they are giving her ensure when they give her the nebulizer treatments at 8:00 am/2:00 and  6:00 PM. Toni Henry requested a call back and for [facility] to be notified to correct this. Toni Henry stated that she is very upset about this.

## 2018-09-30 DIAGNOSIS — R1084 Generalized abdominal pain: Secondary | ICD-10-CM | POA: Diagnosis not present

## 2018-10-03 DIAGNOSIS — S81801A Unspecified open wound, right lower leg, initial encounter: Secondary | ICD-10-CM | POA: Diagnosis not present

## 2018-10-03 DIAGNOSIS — I5030 Unspecified diastolic (congestive) heart failure: Secondary | ICD-10-CM | POA: Diagnosis not present

## 2018-10-06 DIAGNOSIS — R278 Other lack of coordination: Secondary | ICD-10-CM | POA: Diagnosis not present

## 2018-10-06 DIAGNOSIS — R1310 Dysphagia, unspecified: Secondary | ICD-10-CM | POA: Diagnosis not present

## 2018-10-06 DIAGNOSIS — R2689 Other abnormalities of gait and mobility: Secondary | ICD-10-CM | POA: Diagnosis not present

## 2018-10-07 DIAGNOSIS — R2689 Other abnormalities of gait and mobility: Secondary | ICD-10-CM | POA: Diagnosis not present

## 2018-10-07 DIAGNOSIS — R278 Other lack of coordination: Secondary | ICD-10-CM | POA: Diagnosis not present

## 2018-10-07 DIAGNOSIS — R1310 Dysphagia, unspecified: Secondary | ICD-10-CM | POA: Diagnosis not present

## 2018-10-08 DIAGNOSIS — R278 Other lack of coordination: Secondary | ICD-10-CM | POA: Diagnosis not present

## 2018-10-08 DIAGNOSIS — R1310 Dysphagia, unspecified: Secondary | ICD-10-CM | POA: Diagnosis not present

## 2018-10-08 DIAGNOSIS — R2689 Other abnormalities of gait and mobility: Secondary | ICD-10-CM | POA: Diagnosis not present

## 2018-10-09 DIAGNOSIS — G301 Alzheimer's disease with late onset: Secondary | ICD-10-CM | POA: Diagnosis not present

## 2018-10-09 DIAGNOSIS — J439 Emphysema, unspecified: Secondary | ICD-10-CM | POA: Diagnosis not present

## 2018-10-09 DIAGNOSIS — R2689 Other abnormalities of gait and mobility: Secondary | ICD-10-CM | POA: Diagnosis not present

## 2018-10-09 DIAGNOSIS — R1310 Dysphagia, unspecified: Secondary | ICD-10-CM | POA: Diagnosis not present

## 2018-10-09 DIAGNOSIS — E440 Moderate protein-calorie malnutrition: Secondary | ICD-10-CM | POA: Diagnosis not present

## 2018-10-09 DIAGNOSIS — K220 Achalasia of cardia: Secondary | ICD-10-CM | POA: Diagnosis not present

## 2018-10-09 DIAGNOSIS — R278 Other lack of coordination: Secondary | ICD-10-CM | POA: Diagnosis not present

## 2018-10-09 DIAGNOSIS — F39 Unspecified mood [affective] disorder: Secondary | ICD-10-CM

## 2018-10-10 DIAGNOSIS — R2689 Other abnormalities of gait and mobility: Secondary | ICD-10-CM | POA: Diagnosis not present

## 2018-10-10 DIAGNOSIS — R1310 Dysphagia, unspecified: Secondary | ICD-10-CM | POA: Diagnosis not present

## 2018-10-10 DIAGNOSIS — R278 Other lack of coordination: Secondary | ICD-10-CM | POA: Diagnosis not present

## 2018-10-13 DIAGNOSIS — R1310 Dysphagia, unspecified: Secondary | ICD-10-CM | POA: Diagnosis not present

## 2018-10-13 DIAGNOSIS — R2689 Other abnormalities of gait and mobility: Secondary | ICD-10-CM | POA: Diagnosis not present

## 2018-10-13 DIAGNOSIS — R278 Other lack of coordination: Secondary | ICD-10-CM | POA: Diagnosis not present

## 2018-10-15 DIAGNOSIS — R4189 Other symptoms and signs involving cognitive functions and awareness: Secondary | ICD-10-CM | POA: Diagnosis not present

## 2018-10-15 DIAGNOSIS — R278 Other lack of coordination: Secondary | ICD-10-CM | POA: Diagnosis not present

## 2018-10-15 DIAGNOSIS — Z741 Need for assistance with personal care: Secondary | ICD-10-CM | POA: Diagnosis not present

## 2018-10-15 DIAGNOSIS — E43 Unspecified severe protein-calorie malnutrition: Secondary | ICD-10-CM | POA: Diagnosis not present

## 2018-10-15 DIAGNOSIS — F419 Anxiety disorder, unspecified: Secondary | ICD-10-CM | POA: Diagnosis not present

## 2018-10-15 DIAGNOSIS — J9600 Acute respiratory failure, unspecified whether with hypoxia or hypercapnia: Secondary | ICD-10-CM | POA: Diagnosis not present

## 2018-10-15 DIAGNOSIS — R262 Difficulty in walking, not elsewhere classified: Secondary | ICD-10-CM | POA: Diagnosis not present

## 2018-10-17 DIAGNOSIS — R4189 Other symptoms and signs involving cognitive functions and awareness: Secondary | ICD-10-CM | POA: Diagnosis not present

## 2018-10-17 DIAGNOSIS — E43 Unspecified severe protein-calorie malnutrition: Secondary | ICD-10-CM | POA: Diagnosis not present

## 2018-10-17 DIAGNOSIS — J9600 Acute respiratory failure, unspecified whether with hypoxia or hypercapnia: Secondary | ICD-10-CM | POA: Diagnosis not present

## 2018-10-17 DIAGNOSIS — R278 Other lack of coordination: Secondary | ICD-10-CM | POA: Diagnosis not present

## 2018-10-17 DIAGNOSIS — R262 Difficulty in walking, not elsewhere classified: Secondary | ICD-10-CM | POA: Diagnosis not present

## 2018-10-17 DIAGNOSIS — Z741 Need for assistance with personal care: Secondary | ICD-10-CM | POA: Diagnosis not present

## 2018-10-20 DIAGNOSIS — R262 Difficulty in walking, not elsewhere classified: Secondary | ICD-10-CM | POA: Diagnosis not present

## 2018-10-20 DIAGNOSIS — R278 Other lack of coordination: Secondary | ICD-10-CM | POA: Diagnosis not present

## 2018-10-20 DIAGNOSIS — Z741 Need for assistance with personal care: Secondary | ICD-10-CM | POA: Diagnosis not present

## 2018-10-20 DIAGNOSIS — E43 Unspecified severe protein-calorie malnutrition: Secondary | ICD-10-CM | POA: Diagnosis not present

## 2018-10-20 DIAGNOSIS — J9600 Acute respiratory failure, unspecified whether with hypoxia or hypercapnia: Secondary | ICD-10-CM | POA: Diagnosis not present

## 2018-10-20 DIAGNOSIS — R4189 Other symptoms and signs involving cognitive functions and awareness: Secondary | ICD-10-CM | POA: Diagnosis not present

## 2018-10-21 DIAGNOSIS — E43 Unspecified severe protein-calorie malnutrition: Secondary | ICD-10-CM | POA: Diagnosis not present

## 2018-10-21 DIAGNOSIS — R262 Difficulty in walking, not elsewhere classified: Secondary | ICD-10-CM | POA: Diagnosis not present

## 2018-10-21 DIAGNOSIS — Z741 Need for assistance with personal care: Secondary | ICD-10-CM | POA: Diagnosis not present

## 2018-10-21 DIAGNOSIS — R278 Other lack of coordination: Secondary | ICD-10-CM | POA: Diagnosis not present

## 2018-10-21 DIAGNOSIS — J9600 Acute respiratory failure, unspecified whether with hypoxia or hypercapnia: Secondary | ICD-10-CM | POA: Diagnosis not present

## 2018-10-21 DIAGNOSIS — R4189 Other symptoms and signs involving cognitive functions and awareness: Secondary | ICD-10-CM | POA: Diagnosis not present

## 2018-10-22 DIAGNOSIS — Z741 Need for assistance with personal care: Secondary | ICD-10-CM | POA: Diagnosis not present

## 2018-10-22 DIAGNOSIS — R05 Cough: Secondary | ICD-10-CM | POA: Diagnosis not present

## 2018-10-22 DIAGNOSIS — R278 Other lack of coordination: Secondary | ICD-10-CM | POA: Diagnosis not present

## 2018-10-22 DIAGNOSIS — J9600 Acute respiratory failure, unspecified whether with hypoxia or hypercapnia: Secondary | ICD-10-CM | POA: Diagnosis not present

## 2018-10-22 DIAGNOSIS — R0902 Hypoxemia: Secondary | ICD-10-CM | POA: Diagnosis not present

## 2018-10-22 DIAGNOSIS — E43 Unspecified severe protein-calorie malnutrition: Secondary | ICD-10-CM | POA: Diagnosis not present

## 2018-10-22 DIAGNOSIS — R4189 Other symptoms and signs involving cognitive functions and awareness: Secondary | ICD-10-CM | POA: Diagnosis not present

## 2018-10-22 DIAGNOSIS — R262 Difficulty in walking, not elsewhere classified: Secondary | ICD-10-CM | POA: Diagnosis not present

## 2018-10-24 DIAGNOSIS — R4189 Other symptoms and signs involving cognitive functions and awareness: Secondary | ICD-10-CM | POA: Diagnosis not present

## 2018-10-24 DIAGNOSIS — Z741 Need for assistance with personal care: Secondary | ICD-10-CM | POA: Diagnosis not present

## 2018-10-24 DIAGNOSIS — R262 Difficulty in walking, not elsewhere classified: Secondary | ICD-10-CM | POA: Diagnosis not present

## 2018-10-24 DIAGNOSIS — E43 Unspecified severe protein-calorie malnutrition: Secondary | ICD-10-CM | POA: Diagnosis not present

## 2018-10-24 DIAGNOSIS — R278 Other lack of coordination: Secondary | ICD-10-CM | POA: Diagnosis not present

## 2018-10-24 DIAGNOSIS — J9600 Acute respiratory failure, unspecified whether with hypoxia or hypercapnia: Secondary | ICD-10-CM | POA: Diagnosis not present

## 2018-10-26 DIAGNOSIS — R4189 Other symptoms and signs involving cognitive functions and awareness: Secondary | ICD-10-CM | POA: Diagnosis not present

## 2018-10-26 DIAGNOSIS — R278 Other lack of coordination: Secondary | ICD-10-CM | POA: Diagnosis not present

## 2018-10-26 DIAGNOSIS — Z741 Need for assistance with personal care: Secondary | ICD-10-CM | POA: Diagnosis not present

## 2018-10-26 DIAGNOSIS — K220 Achalasia of cardia: Secondary | ICD-10-CM | POA: Diagnosis not present

## 2018-10-27 DIAGNOSIS — R4189 Other symptoms and signs involving cognitive functions and awareness: Secondary | ICD-10-CM | POA: Diagnosis not present

## 2018-10-27 DIAGNOSIS — Z741 Need for assistance with personal care: Secondary | ICD-10-CM | POA: Diagnosis not present

## 2018-10-27 DIAGNOSIS — R278 Other lack of coordination: Secondary | ICD-10-CM | POA: Diagnosis not present

## 2018-10-27 DIAGNOSIS — K220 Achalasia of cardia: Secondary | ICD-10-CM | POA: Diagnosis not present

## 2018-10-28 DIAGNOSIS — K220 Achalasia of cardia: Secondary | ICD-10-CM | POA: Diagnosis not present

## 2018-10-28 DIAGNOSIS — R278 Other lack of coordination: Secondary | ICD-10-CM | POA: Diagnosis not present

## 2018-10-28 DIAGNOSIS — Z741 Need for assistance with personal care: Secondary | ICD-10-CM | POA: Diagnosis not present

## 2018-10-28 DIAGNOSIS — R4189 Other symptoms and signs involving cognitive functions and awareness: Secondary | ICD-10-CM | POA: Diagnosis not present

## 2018-10-29 DIAGNOSIS — R4189 Other symptoms and signs involving cognitive functions and awareness: Secondary | ICD-10-CM | POA: Diagnosis not present

## 2018-10-29 DIAGNOSIS — Z741 Need for assistance with personal care: Secondary | ICD-10-CM | POA: Diagnosis not present

## 2018-10-29 DIAGNOSIS — K220 Achalasia of cardia: Secondary | ICD-10-CM | POA: Diagnosis not present

## 2018-10-29 DIAGNOSIS — R278 Other lack of coordination: Secondary | ICD-10-CM | POA: Diagnosis not present

## 2018-10-30 DIAGNOSIS — Z79899 Other long term (current) drug therapy: Secondary | ICD-10-CM | POA: Diagnosis not present

## 2018-10-31 DIAGNOSIS — R278 Other lack of coordination: Secondary | ICD-10-CM | POA: Diagnosis not present

## 2018-10-31 DIAGNOSIS — K220 Achalasia of cardia: Secondary | ICD-10-CM | POA: Diagnosis not present

## 2018-10-31 DIAGNOSIS — R4189 Other symptoms and signs involving cognitive functions and awareness: Secondary | ICD-10-CM | POA: Diagnosis not present

## 2018-10-31 DIAGNOSIS — Z741 Need for assistance with personal care: Secondary | ICD-10-CM | POA: Diagnosis not present

## 2018-11-04 DIAGNOSIS — R4189 Other symptoms and signs involving cognitive functions and awareness: Secondary | ICD-10-CM | POA: Diagnosis not present

## 2018-11-04 DIAGNOSIS — Z741 Need for assistance with personal care: Secondary | ICD-10-CM | POA: Diagnosis not present

## 2018-11-04 DIAGNOSIS — R278 Other lack of coordination: Secondary | ICD-10-CM | POA: Diagnosis not present

## 2018-11-04 DIAGNOSIS — K220 Achalasia of cardia: Secondary | ICD-10-CM | POA: Diagnosis not present

## 2018-11-05 DIAGNOSIS — J449 Chronic obstructive pulmonary disease, unspecified: Secondary | ICD-10-CM | POA: Diagnosis not present

## 2018-11-05 DIAGNOSIS — G309 Alzheimer's disease, unspecified: Secondary | ICD-10-CM | POA: Diagnosis not present

## 2018-11-05 DIAGNOSIS — Z741 Need for assistance with personal care: Secondary | ICD-10-CM | POA: Diagnosis not present

## 2018-11-05 DIAGNOSIS — R4189 Other symptoms and signs involving cognitive functions and awareness: Secondary | ICD-10-CM | POA: Diagnosis not present

## 2018-11-05 DIAGNOSIS — F39 Unspecified mood [affective] disorder: Secondary | ICD-10-CM | POA: Diagnosis not present

## 2018-11-05 DIAGNOSIS — K220 Achalasia of cardia: Secondary | ICD-10-CM | POA: Diagnosis not present

## 2018-11-05 DIAGNOSIS — I872 Venous insufficiency (chronic) (peripheral): Secondary | ICD-10-CM | POA: Diagnosis not present

## 2018-11-05 DIAGNOSIS — E43 Unspecified severe protein-calorie malnutrition: Secondary | ICD-10-CM | POA: Diagnosis not present

## 2018-11-05 DIAGNOSIS — R278 Other lack of coordination: Secondary | ICD-10-CM | POA: Diagnosis not present

## 2018-11-06 DIAGNOSIS — K222 Esophageal obstruction: Secondary | ICD-10-CM | POA: Diagnosis not present

## 2018-11-06 DIAGNOSIS — K219 Gastro-esophageal reflux disease without esophagitis: Secondary | ICD-10-CM | POA: Diagnosis not present

## 2018-11-06 DIAGNOSIS — Z681 Body mass index (BMI) 19 or less, adult: Secondary | ICD-10-CM | POA: Diagnosis not present

## 2018-11-06 DIAGNOSIS — I4891 Unspecified atrial fibrillation: Secondary | ICD-10-CM | POA: Diagnosis not present

## 2018-11-06 DIAGNOSIS — I509 Heart failure, unspecified: Secondary | ICD-10-CM | POA: Diagnosis not present

## 2018-11-06 DIAGNOSIS — Z9981 Dependence on supplemental oxygen: Secondary | ICD-10-CM | POA: Diagnosis not present

## 2018-11-06 DIAGNOSIS — R634 Abnormal weight loss: Secondary | ICD-10-CM | POA: Diagnosis not present

## 2018-11-06 DIAGNOSIS — F0390 Unspecified dementia without behavioral disturbance: Secondary | ICD-10-CM | POA: Diagnosis not present

## 2018-11-06 DIAGNOSIS — K220 Achalasia of cardia: Secondary | ICD-10-CM | POA: Diagnosis not present

## 2018-11-06 DIAGNOSIS — R1310 Dysphagia, unspecified: Secondary | ICD-10-CM | POA: Diagnosis not present

## 2018-11-10 DIAGNOSIS — K219 Gastro-esophageal reflux disease without esophagitis: Secondary | ICD-10-CM | POA: Diagnosis not present

## 2018-11-10 DIAGNOSIS — I509 Heart failure, unspecified: Secondary | ICD-10-CM | POA: Diagnosis not present

## 2018-11-10 DIAGNOSIS — K220 Achalasia of cardia: Secondary | ICD-10-CM | POA: Diagnosis not present

## 2018-11-10 DIAGNOSIS — K222 Esophageal obstruction: Secondary | ICD-10-CM | POA: Diagnosis not present

## 2018-11-10 DIAGNOSIS — R634 Abnormal weight loss: Secondary | ICD-10-CM | POA: Diagnosis not present

## 2018-11-10 DIAGNOSIS — I4891 Unspecified atrial fibrillation: Secondary | ICD-10-CM | POA: Diagnosis not present

## 2018-11-11 DIAGNOSIS — I4891 Unspecified atrial fibrillation: Secondary | ICD-10-CM | POA: Diagnosis not present

## 2018-11-11 DIAGNOSIS — K222 Esophageal obstruction: Secondary | ICD-10-CM | POA: Diagnosis not present

## 2018-11-11 DIAGNOSIS — K219 Gastro-esophageal reflux disease without esophagitis: Secondary | ICD-10-CM | POA: Diagnosis not present

## 2018-11-11 DIAGNOSIS — I509 Heart failure, unspecified: Secondary | ICD-10-CM | POA: Diagnosis not present

## 2018-11-11 DIAGNOSIS — R634 Abnormal weight loss: Secondary | ICD-10-CM | POA: Diagnosis not present

## 2018-11-11 DIAGNOSIS — K220 Achalasia of cardia: Secondary | ICD-10-CM | POA: Diagnosis not present

## 2018-11-12 DIAGNOSIS — K219 Gastro-esophageal reflux disease without esophagitis: Secondary | ICD-10-CM | POA: Diagnosis not present

## 2018-11-12 DIAGNOSIS — R2243 Localized swelling, mass and lump, lower limb, bilateral: Secondary | ICD-10-CM

## 2018-11-12 DIAGNOSIS — K220 Achalasia of cardia: Secondary | ICD-10-CM | POA: Diagnosis not present

## 2018-11-12 DIAGNOSIS — K222 Esophageal obstruction: Secondary | ICD-10-CM | POA: Diagnosis not present

## 2018-11-12 DIAGNOSIS — L538 Other specified erythematous conditions: Secondary | ICD-10-CM

## 2018-11-12 DIAGNOSIS — I509 Heart failure, unspecified: Secondary | ICD-10-CM | POA: Diagnosis not present

## 2018-11-12 DIAGNOSIS — I4891 Unspecified atrial fibrillation: Secondary | ICD-10-CM | POA: Diagnosis not present

## 2018-11-12 DIAGNOSIS — R634 Abnormal weight loss: Secondary | ICD-10-CM | POA: Diagnosis not present

## 2018-11-13 DIAGNOSIS — K222 Esophageal obstruction: Secondary | ICD-10-CM | POA: Diagnosis not present

## 2018-11-13 DIAGNOSIS — K219 Gastro-esophageal reflux disease without esophagitis: Secondary | ICD-10-CM | POA: Diagnosis not present

## 2018-11-13 DIAGNOSIS — I4891 Unspecified atrial fibrillation: Secondary | ICD-10-CM | POA: Diagnosis not present

## 2018-11-13 DIAGNOSIS — K220 Achalasia of cardia: Secondary | ICD-10-CM | POA: Diagnosis not present

## 2018-11-13 DIAGNOSIS — I509 Heart failure, unspecified: Secondary | ICD-10-CM | POA: Diagnosis not present

## 2018-11-13 DIAGNOSIS — R634 Abnormal weight loss: Secondary | ICD-10-CM | POA: Diagnosis not present

## 2018-11-14 DIAGNOSIS — I509 Heart failure, unspecified: Secondary | ICD-10-CM | POA: Diagnosis not present

## 2018-11-14 DIAGNOSIS — K222 Esophageal obstruction: Secondary | ICD-10-CM | POA: Diagnosis not present

## 2018-11-14 DIAGNOSIS — L539 Erythematous condition, unspecified: Secondary | ICD-10-CM | POA: Diagnosis not present

## 2018-11-14 DIAGNOSIS — R229 Localized swelling, mass and lump, unspecified: Secondary | ICD-10-CM | POA: Diagnosis not present

## 2018-11-14 DIAGNOSIS — R634 Abnormal weight loss: Secondary | ICD-10-CM | POA: Diagnosis not present

## 2018-11-14 DIAGNOSIS — K220 Achalasia of cardia: Secondary | ICD-10-CM | POA: Diagnosis not present

## 2018-11-14 DIAGNOSIS — K219 Gastro-esophageal reflux disease without esophagitis: Secondary | ICD-10-CM | POA: Diagnosis not present

## 2018-11-14 DIAGNOSIS — I4891 Unspecified atrial fibrillation: Secondary | ICD-10-CM | POA: Diagnosis not present

## 2018-11-17 DIAGNOSIS — R634 Abnormal weight loss: Secondary | ICD-10-CM | POA: Diagnosis not present

## 2018-11-17 DIAGNOSIS — K222 Esophageal obstruction: Secondary | ICD-10-CM | POA: Diagnosis not present

## 2018-11-17 DIAGNOSIS — K220 Achalasia of cardia: Secondary | ICD-10-CM | POA: Diagnosis not present

## 2018-11-17 DIAGNOSIS — I4891 Unspecified atrial fibrillation: Secondary | ICD-10-CM | POA: Diagnosis not present

## 2018-11-17 DIAGNOSIS — K219 Gastro-esophageal reflux disease without esophagitis: Secondary | ICD-10-CM | POA: Diagnosis not present

## 2018-11-17 DIAGNOSIS — I509 Heart failure, unspecified: Secondary | ICD-10-CM | POA: Diagnosis not present

## 2018-11-19 DIAGNOSIS — R634 Abnormal weight loss: Secondary | ICD-10-CM | POA: Diagnosis not present

## 2018-11-19 DIAGNOSIS — I509 Heart failure, unspecified: Secondary | ICD-10-CM | POA: Diagnosis not present

## 2018-11-19 DIAGNOSIS — I4891 Unspecified atrial fibrillation: Secondary | ICD-10-CM | POA: Diagnosis not present

## 2018-11-19 DIAGNOSIS — K220 Achalasia of cardia: Secondary | ICD-10-CM | POA: Diagnosis not present

## 2018-11-19 DIAGNOSIS — K219 Gastro-esophageal reflux disease without esophagitis: Secondary | ICD-10-CM | POA: Diagnosis not present

## 2018-11-19 DIAGNOSIS — K222 Esophageal obstruction: Secondary | ICD-10-CM | POA: Diagnosis not present

## 2018-11-21 DIAGNOSIS — K219 Gastro-esophageal reflux disease without esophagitis: Secondary | ICD-10-CM | POA: Diagnosis not present

## 2018-11-21 DIAGNOSIS — R634 Abnormal weight loss: Secondary | ICD-10-CM | POA: Diagnosis not present

## 2018-11-21 DIAGNOSIS — I509 Heart failure, unspecified: Secondary | ICD-10-CM | POA: Diagnosis not present

## 2018-11-21 DIAGNOSIS — I4891 Unspecified atrial fibrillation: Secondary | ICD-10-CM | POA: Diagnosis not present

## 2018-11-21 DIAGNOSIS — I5030 Unspecified diastolic (congestive) heart failure: Secondary | ICD-10-CM

## 2018-11-21 DIAGNOSIS — K220 Achalasia of cardia: Secondary | ICD-10-CM | POA: Diagnosis not present

## 2018-11-21 DIAGNOSIS — K222 Esophageal obstruction: Secondary | ICD-10-CM | POA: Diagnosis not present

## 2018-11-24 DIAGNOSIS — K219 Gastro-esophageal reflux disease without esophagitis: Secondary | ICD-10-CM | POA: Diagnosis not present

## 2018-11-24 DIAGNOSIS — I509 Heart failure, unspecified: Secondary | ICD-10-CM | POA: Diagnosis not present

## 2018-11-24 DIAGNOSIS — K222 Esophageal obstruction: Secondary | ICD-10-CM | POA: Diagnosis not present

## 2018-11-24 DIAGNOSIS — R634 Abnormal weight loss: Secondary | ICD-10-CM | POA: Diagnosis not present

## 2018-11-24 DIAGNOSIS — I4891 Unspecified atrial fibrillation: Secondary | ICD-10-CM | POA: Diagnosis not present

## 2018-11-24 DIAGNOSIS — K220 Achalasia of cardia: Secondary | ICD-10-CM | POA: Diagnosis not present

## 2018-11-26 DIAGNOSIS — I4891 Unspecified atrial fibrillation: Secondary | ICD-10-CM | POA: Diagnosis not present

## 2018-11-26 DIAGNOSIS — R1310 Dysphagia, unspecified: Secondary | ICD-10-CM | POA: Diagnosis not present

## 2018-11-26 DIAGNOSIS — K219 Gastro-esophageal reflux disease without esophagitis: Secondary | ICD-10-CM | POA: Diagnosis not present

## 2018-11-26 DIAGNOSIS — I509 Heart failure, unspecified: Secondary | ICD-10-CM | POA: Diagnosis not present

## 2018-11-26 DIAGNOSIS — R634 Abnormal weight loss: Secondary | ICD-10-CM | POA: Diagnosis not present

## 2018-11-26 DIAGNOSIS — Z681 Body mass index (BMI) 19 or less, adult: Secondary | ICD-10-CM | POA: Diagnosis not present

## 2018-11-26 DIAGNOSIS — K220 Achalasia of cardia: Secondary | ICD-10-CM | POA: Diagnosis not present

## 2018-11-26 DIAGNOSIS — F0390 Unspecified dementia without behavioral disturbance: Secondary | ICD-10-CM | POA: Diagnosis not present

## 2018-11-26 DIAGNOSIS — Z9981 Dependence on supplemental oxygen: Secondary | ICD-10-CM | POA: Diagnosis not present

## 2018-11-26 DIAGNOSIS — K222 Esophageal obstruction: Secondary | ICD-10-CM | POA: Diagnosis not present

## 2018-11-28 DIAGNOSIS — I509 Heart failure, unspecified: Secondary | ICD-10-CM | POA: Diagnosis not present

## 2018-11-28 DIAGNOSIS — K219 Gastro-esophageal reflux disease without esophagitis: Secondary | ICD-10-CM | POA: Diagnosis not present

## 2018-11-28 DIAGNOSIS — K220 Achalasia of cardia: Secondary | ICD-10-CM | POA: Diagnosis not present

## 2018-11-28 DIAGNOSIS — R634 Abnormal weight loss: Secondary | ICD-10-CM | POA: Diagnosis not present

## 2018-11-28 DIAGNOSIS — I4891 Unspecified atrial fibrillation: Secondary | ICD-10-CM | POA: Diagnosis not present

## 2018-11-28 DIAGNOSIS — K222 Esophageal obstruction: Secondary | ICD-10-CM | POA: Diagnosis not present

## 2018-12-10 DIAGNOSIS — J449 Chronic obstructive pulmonary disease, unspecified: Secondary | ICD-10-CM | POA: Diagnosis not present

## 2018-12-10 DIAGNOSIS — R05 Cough: Secondary | ICD-10-CM | POA: Diagnosis not present

## 2018-12-12 DIAGNOSIS — R093 Abnormal sputum: Secondary | ICD-10-CM | POA: Diagnosis not present

## 2018-12-12 DIAGNOSIS — R0602 Shortness of breath: Secondary | ICD-10-CM | POA: Diagnosis not present

## 2018-12-12 DIAGNOSIS — R05 Cough: Secondary | ICD-10-CM | POA: Diagnosis not present

## 2018-12-15 DIAGNOSIS — J439 Emphysema, unspecified: Secondary | ICD-10-CM | POA: Diagnosis not present

## 2018-12-22 DIAGNOSIS — K222 Esophageal obstruction: Secondary | ICD-10-CM | POA: Diagnosis not present

## 2018-12-22 DIAGNOSIS — I4891 Unspecified atrial fibrillation: Secondary | ICD-10-CM | POA: Diagnosis not present

## 2018-12-22 DIAGNOSIS — R634 Abnormal weight loss: Secondary | ICD-10-CM | POA: Diagnosis not present

## 2018-12-22 DIAGNOSIS — I509 Heart failure, unspecified: Secondary | ICD-10-CM | POA: Diagnosis not present

## 2018-12-22 DIAGNOSIS — K219 Gastro-esophageal reflux disease without esophagitis: Secondary | ICD-10-CM | POA: Diagnosis not present

## 2018-12-22 DIAGNOSIS — K220 Achalasia of cardia: Secondary | ICD-10-CM | POA: Diagnosis not present

## 2018-12-24 DIAGNOSIS — B370 Candidal stomatitis: Secondary | ICD-10-CM

## 2018-12-26 DIAGNOSIS — I509 Heart failure, unspecified: Secondary | ICD-10-CM | POA: Diagnosis not present

## 2018-12-26 DIAGNOSIS — F39 Unspecified mood [affective] disorder: Secondary | ICD-10-CM | POA: Diagnosis not present

## 2018-12-26 DIAGNOSIS — I4891 Unspecified atrial fibrillation: Secondary | ICD-10-CM | POA: Diagnosis not present

## 2018-12-26 DIAGNOSIS — Z9981 Dependence on supplemental oxygen: Secondary | ICD-10-CM | POA: Diagnosis not present

## 2018-12-26 DIAGNOSIS — I872 Venous insufficiency (chronic) (peripheral): Secondary | ICD-10-CM | POA: Diagnosis not present

## 2018-12-26 DIAGNOSIS — K219 Gastro-esophageal reflux disease without esophagitis: Secondary | ICD-10-CM | POA: Diagnosis not present

## 2018-12-26 DIAGNOSIS — K220 Achalasia of cardia: Secondary | ICD-10-CM | POA: Diagnosis not present

## 2018-12-26 DIAGNOSIS — E43 Unspecified severe protein-calorie malnutrition: Secondary | ICD-10-CM | POA: Diagnosis not present

## 2018-12-26 DIAGNOSIS — F0390 Unspecified dementia without behavioral disturbance: Secondary | ICD-10-CM | POA: Diagnosis not present

## 2018-12-26 DIAGNOSIS — R062 Wheezing: Secondary | ICD-10-CM | POA: Diagnosis not present

## 2018-12-26 DIAGNOSIS — R1310 Dysphagia, unspecified: Secondary | ICD-10-CM | POA: Diagnosis not present

## 2018-12-26 DIAGNOSIS — K222 Esophageal obstruction: Secondary | ICD-10-CM | POA: Diagnosis not present

## 2018-12-26 DIAGNOSIS — J449 Chronic obstructive pulmonary disease, unspecified: Secondary | ICD-10-CM | POA: Diagnosis not present

## 2018-12-26 DIAGNOSIS — R0600 Dyspnea, unspecified: Secondary | ICD-10-CM | POA: Diagnosis not present

## 2018-12-26 DIAGNOSIS — G309 Alzheimer's disease, unspecified: Secondary | ICD-10-CM | POA: Diagnosis not present

## 2018-12-26 DIAGNOSIS — Z681 Body mass index (BMI) 19 or less, adult: Secondary | ICD-10-CM | POA: Diagnosis not present

## 2018-12-26 DIAGNOSIS — R634 Abnormal weight loss: Secondary | ICD-10-CM | POA: Diagnosis not present

## 2018-12-31 DIAGNOSIS — I4891 Unspecified atrial fibrillation: Secondary | ICD-10-CM | POA: Diagnosis not present

## 2018-12-31 DIAGNOSIS — K220 Achalasia of cardia: Secondary | ICD-10-CM | POA: Diagnosis not present

## 2018-12-31 DIAGNOSIS — R634 Abnormal weight loss: Secondary | ICD-10-CM | POA: Diagnosis not present

## 2018-12-31 DIAGNOSIS — K222 Esophageal obstruction: Secondary | ICD-10-CM | POA: Diagnosis not present

## 2018-12-31 DIAGNOSIS — I509 Heart failure, unspecified: Secondary | ICD-10-CM | POA: Diagnosis not present

## 2018-12-31 DIAGNOSIS — K219 Gastro-esophageal reflux disease without esophagitis: Secondary | ICD-10-CM | POA: Diagnosis not present

## 2019-01-02 DIAGNOSIS — K219 Gastro-esophageal reflux disease without esophagitis: Secondary | ICD-10-CM | POA: Diagnosis not present

## 2019-01-02 DIAGNOSIS — I509 Heart failure, unspecified: Secondary | ICD-10-CM | POA: Diagnosis not present

## 2019-01-02 DIAGNOSIS — I4891 Unspecified atrial fibrillation: Secondary | ICD-10-CM | POA: Diagnosis not present

## 2019-01-02 DIAGNOSIS — K220 Achalasia of cardia: Secondary | ICD-10-CM | POA: Diagnosis not present

## 2019-01-02 DIAGNOSIS — K222 Esophageal obstruction: Secondary | ICD-10-CM | POA: Diagnosis not present

## 2019-01-02 DIAGNOSIS — R634 Abnormal weight loss: Secondary | ICD-10-CM | POA: Diagnosis not present

## 2019-01-15 DIAGNOSIS — K222 Esophageal obstruction: Secondary | ICD-10-CM | POA: Diagnosis not present

## 2019-01-15 DIAGNOSIS — R634 Abnormal weight loss: Secondary | ICD-10-CM | POA: Diagnosis not present

## 2019-01-15 DIAGNOSIS — I509 Heart failure, unspecified: Secondary | ICD-10-CM | POA: Diagnosis not present

## 2019-01-15 DIAGNOSIS — I4891 Unspecified atrial fibrillation: Secondary | ICD-10-CM | POA: Diagnosis not present

## 2019-01-15 DIAGNOSIS — K220 Achalasia of cardia: Secondary | ICD-10-CM | POA: Diagnosis not present

## 2019-01-15 DIAGNOSIS — K219 Gastro-esophageal reflux disease without esophagitis: Secondary | ICD-10-CM | POA: Diagnosis not present

## 2019-01-20 DIAGNOSIS — I4891 Unspecified atrial fibrillation: Secondary | ICD-10-CM | POA: Diagnosis not present

## 2019-01-20 DIAGNOSIS — K220 Achalasia of cardia: Secondary | ICD-10-CM | POA: Diagnosis not present

## 2019-01-20 DIAGNOSIS — I509 Heart failure, unspecified: Secondary | ICD-10-CM | POA: Diagnosis not present

## 2019-01-20 DIAGNOSIS — R634 Abnormal weight loss: Secondary | ICD-10-CM | POA: Diagnosis not present

## 2019-01-20 DIAGNOSIS — K219 Gastro-esophageal reflux disease without esophagitis: Secondary | ICD-10-CM | POA: Diagnosis not present

## 2019-01-20 DIAGNOSIS — K222 Esophageal obstruction: Secondary | ICD-10-CM | POA: Diagnosis not present

## 2019-01-21 DIAGNOSIS — I4891 Unspecified atrial fibrillation: Secondary | ICD-10-CM | POA: Diagnosis not present

## 2019-01-21 DIAGNOSIS — K219 Gastro-esophageal reflux disease without esophagitis: Secondary | ICD-10-CM | POA: Diagnosis not present

## 2019-01-21 DIAGNOSIS — I509 Heart failure, unspecified: Secondary | ICD-10-CM | POA: Diagnosis not present

## 2019-01-21 DIAGNOSIS — K220 Achalasia of cardia: Secondary | ICD-10-CM | POA: Diagnosis not present

## 2019-01-21 DIAGNOSIS — K222 Esophageal obstruction: Secondary | ICD-10-CM | POA: Diagnosis not present

## 2019-01-21 DIAGNOSIS — R634 Abnormal weight loss: Secondary | ICD-10-CM | POA: Diagnosis not present

## 2019-01-23 DIAGNOSIS — I4891 Unspecified atrial fibrillation: Secondary | ICD-10-CM | POA: Diagnosis not present

## 2019-01-23 DIAGNOSIS — K222 Esophageal obstruction: Secondary | ICD-10-CM | POA: Diagnosis not present

## 2019-01-23 DIAGNOSIS — I509 Heart failure, unspecified: Secondary | ICD-10-CM | POA: Diagnosis not present

## 2019-01-23 DIAGNOSIS — K219 Gastro-esophageal reflux disease without esophagitis: Secondary | ICD-10-CM | POA: Diagnosis not present

## 2019-01-23 DIAGNOSIS — R634 Abnormal weight loss: Secondary | ICD-10-CM | POA: Diagnosis not present

## 2019-01-23 DIAGNOSIS — K220 Achalasia of cardia: Secondary | ICD-10-CM | POA: Diagnosis not present

## 2019-01-26 DIAGNOSIS — F0390 Unspecified dementia without behavioral disturbance: Secondary | ICD-10-CM | POA: Diagnosis not present

## 2019-01-26 DIAGNOSIS — K219 Gastro-esophageal reflux disease without esophagitis: Secondary | ICD-10-CM | POA: Diagnosis not present

## 2019-01-26 DIAGNOSIS — R1310 Dysphagia, unspecified: Secondary | ICD-10-CM | POA: Diagnosis not present

## 2019-01-26 DIAGNOSIS — K220 Achalasia of cardia: Secondary | ICD-10-CM | POA: Diagnosis not present

## 2019-01-26 DIAGNOSIS — Z681 Body mass index (BMI) 19 or less, adult: Secondary | ICD-10-CM | POA: Diagnosis not present

## 2019-01-26 DIAGNOSIS — R159 Full incontinence of feces: Secondary | ICD-10-CM | POA: Diagnosis not present

## 2019-01-26 DIAGNOSIS — R634 Abnormal weight loss: Secondary | ICD-10-CM | POA: Diagnosis not present

## 2019-01-26 DIAGNOSIS — J449 Chronic obstructive pulmonary disease, unspecified: Secondary | ICD-10-CM | POA: Diagnosis not present

## 2019-01-26 DIAGNOSIS — R32 Unspecified urinary incontinence: Secondary | ICD-10-CM | POA: Diagnosis not present

## 2019-01-26 DIAGNOSIS — Z9981 Dependence on supplemental oxygen: Secondary | ICD-10-CM | POA: Diagnosis not present

## 2019-01-26 DIAGNOSIS — K222 Esophageal obstruction: Secondary | ICD-10-CM | POA: Diagnosis not present

## 2019-01-26 DIAGNOSIS — I509 Heart failure, unspecified: Secondary | ICD-10-CM | POA: Diagnosis not present

## 2019-01-27 DIAGNOSIS — K219 Gastro-esophageal reflux disease without esophagitis: Secondary | ICD-10-CM | POA: Diagnosis not present

## 2019-01-27 DIAGNOSIS — I509 Heart failure, unspecified: Secondary | ICD-10-CM | POA: Diagnosis not present

## 2019-01-27 DIAGNOSIS — K222 Esophageal obstruction: Secondary | ICD-10-CM | POA: Diagnosis not present

## 2019-01-27 DIAGNOSIS — K220 Achalasia of cardia: Secondary | ICD-10-CM | POA: Diagnosis not present

## 2019-01-27 DIAGNOSIS — J449 Chronic obstructive pulmonary disease, unspecified: Secondary | ICD-10-CM | POA: Diagnosis not present

## 2019-01-27 DIAGNOSIS — R634 Abnormal weight loss: Secondary | ICD-10-CM | POA: Diagnosis not present

## 2019-01-29 DIAGNOSIS — R634 Abnormal weight loss: Secondary | ICD-10-CM | POA: Diagnosis not present

## 2019-01-29 DIAGNOSIS — K220 Achalasia of cardia: Secondary | ICD-10-CM | POA: Diagnosis not present

## 2019-01-29 DIAGNOSIS — J449 Chronic obstructive pulmonary disease, unspecified: Secondary | ICD-10-CM | POA: Diagnosis not present

## 2019-01-29 DIAGNOSIS — K222 Esophageal obstruction: Secondary | ICD-10-CM | POA: Diagnosis not present

## 2019-01-29 DIAGNOSIS — I509 Heart failure, unspecified: Secondary | ICD-10-CM | POA: Diagnosis not present

## 2019-01-29 DIAGNOSIS — K219 Gastro-esophageal reflux disease without esophagitis: Secondary | ICD-10-CM | POA: Diagnosis not present

## 2019-01-30 DIAGNOSIS — K220 Achalasia of cardia: Secondary | ICD-10-CM | POA: Diagnosis not present

## 2019-01-30 DIAGNOSIS — J449 Chronic obstructive pulmonary disease, unspecified: Secondary | ICD-10-CM | POA: Diagnosis not present

## 2019-01-30 DIAGNOSIS — R634 Abnormal weight loss: Secondary | ICD-10-CM | POA: Diagnosis not present

## 2019-01-30 DIAGNOSIS — I509 Heart failure, unspecified: Secondary | ICD-10-CM | POA: Diagnosis not present

## 2019-01-30 DIAGNOSIS — K219 Gastro-esophageal reflux disease without esophagitis: Secondary | ICD-10-CM | POA: Diagnosis not present

## 2019-01-30 DIAGNOSIS — K222 Esophageal obstruction: Secondary | ICD-10-CM | POA: Diagnosis not present

## 2019-02-03 DIAGNOSIS — R634 Abnormal weight loss: Secondary | ICD-10-CM | POA: Diagnosis not present

## 2019-02-03 DIAGNOSIS — K222 Esophageal obstruction: Secondary | ICD-10-CM | POA: Diagnosis not present

## 2019-02-03 DIAGNOSIS — K219 Gastro-esophageal reflux disease without esophagitis: Secondary | ICD-10-CM | POA: Diagnosis not present

## 2019-02-03 DIAGNOSIS — K220 Achalasia of cardia: Secondary | ICD-10-CM | POA: Diagnosis not present

## 2019-02-03 DIAGNOSIS — J449 Chronic obstructive pulmonary disease, unspecified: Secondary | ICD-10-CM | POA: Diagnosis not present

## 2019-02-03 DIAGNOSIS — I509 Heart failure, unspecified: Secondary | ICD-10-CM | POA: Diagnosis not present

## 2019-02-06 DIAGNOSIS — I509 Heart failure, unspecified: Secondary | ICD-10-CM | POA: Diagnosis not present

## 2019-02-06 DIAGNOSIS — K222 Esophageal obstruction: Secondary | ICD-10-CM | POA: Diagnosis not present

## 2019-02-06 DIAGNOSIS — J449 Chronic obstructive pulmonary disease, unspecified: Secondary | ICD-10-CM | POA: Diagnosis not present

## 2019-02-06 DIAGNOSIS — R634 Abnormal weight loss: Secondary | ICD-10-CM | POA: Diagnosis not present

## 2019-02-06 DIAGNOSIS — K219 Gastro-esophageal reflux disease without esophagitis: Secondary | ICD-10-CM | POA: Diagnosis not present

## 2019-02-06 DIAGNOSIS — K220 Achalasia of cardia: Secondary | ICD-10-CM | POA: Diagnosis not present

## 2019-02-10 DIAGNOSIS — K222 Esophageal obstruction: Secondary | ICD-10-CM | POA: Diagnosis not present

## 2019-02-10 DIAGNOSIS — R634 Abnormal weight loss: Secondary | ICD-10-CM | POA: Diagnosis not present

## 2019-02-10 DIAGNOSIS — K220 Achalasia of cardia: Secondary | ICD-10-CM | POA: Diagnosis not present

## 2019-02-10 DIAGNOSIS — I509 Heart failure, unspecified: Secondary | ICD-10-CM | POA: Diagnosis not present

## 2019-02-10 DIAGNOSIS — J449 Chronic obstructive pulmonary disease, unspecified: Secondary | ICD-10-CM | POA: Diagnosis not present

## 2019-02-10 DIAGNOSIS — K219 Gastro-esophageal reflux disease without esophagitis: Secondary | ICD-10-CM | POA: Diagnosis not present

## 2019-02-15 DIAGNOSIS — K220 Achalasia of cardia: Secondary | ICD-10-CM | POA: Diagnosis not present

## 2019-02-15 DIAGNOSIS — J449 Chronic obstructive pulmonary disease, unspecified: Secondary | ICD-10-CM | POA: Diagnosis not present

## 2019-02-15 DIAGNOSIS — K222 Esophageal obstruction: Secondary | ICD-10-CM | POA: Diagnosis not present

## 2019-02-15 DIAGNOSIS — I509 Heart failure, unspecified: Secondary | ICD-10-CM | POA: Diagnosis not present

## 2019-02-15 DIAGNOSIS — R634 Abnormal weight loss: Secondary | ICD-10-CM | POA: Diagnosis not present

## 2019-02-15 DIAGNOSIS — K219 Gastro-esophageal reflux disease without esophagitis: Secondary | ICD-10-CM | POA: Diagnosis not present

## 2019-02-16 DIAGNOSIS — J441 Chronic obstructive pulmonary disease with (acute) exacerbation: Secondary | ICD-10-CM | POA: Diagnosis not present

## 2019-02-16 DIAGNOSIS — R0602 Shortness of breath: Secondary | ICD-10-CM

## 2019-02-16 DIAGNOSIS — R05 Cough: Secondary | ICD-10-CM | POA: Diagnosis not present

## 2019-02-17 DIAGNOSIS — R634 Abnormal weight loss: Secondary | ICD-10-CM | POA: Diagnosis not present

## 2019-02-17 DIAGNOSIS — K219 Gastro-esophageal reflux disease without esophagitis: Secondary | ICD-10-CM | POA: Diagnosis not present

## 2019-02-17 DIAGNOSIS — J449 Chronic obstructive pulmonary disease, unspecified: Secondary | ICD-10-CM | POA: Diagnosis not present

## 2019-02-17 DIAGNOSIS — I509 Heart failure, unspecified: Secondary | ICD-10-CM | POA: Diagnosis not present

## 2019-02-17 DIAGNOSIS — K220 Achalasia of cardia: Secondary | ICD-10-CM | POA: Diagnosis not present

## 2019-02-17 DIAGNOSIS — K222 Esophageal obstruction: Secondary | ICD-10-CM | POA: Diagnosis not present

## 2019-02-22 DIAGNOSIS — R634 Abnormal weight loss: Secondary | ICD-10-CM | POA: Diagnosis not present

## 2019-02-22 DIAGNOSIS — K220 Achalasia of cardia: Secondary | ICD-10-CM | POA: Diagnosis not present

## 2019-02-22 DIAGNOSIS — I509 Heart failure, unspecified: Secondary | ICD-10-CM | POA: Diagnosis not present

## 2019-02-22 DIAGNOSIS — J449 Chronic obstructive pulmonary disease, unspecified: Secondary | ICD-10-CM | POA: Diagnosis not present

## 2019-02-22 DIAGNOSIS — K222 Esophageal obstruction: Secondary | ICD-10-CM | POA: Diagnosis not present

## 2019-02-22 DIAGNOSIS — K219 Gastro-esophageal reflux disease without esophagitis: Secondary | ICD-10-CM | POA: Diagnosis not present

## 2019-02-24 DIAGNOSIS — K220 Achalasia of cardia: Secondary | ICD-10-CM | POA: Diagnosis not present

## 2019-02-24 DIAGNOSIS — K219 Gastro-esophageal reflux disease without esophagitis: Secondary | ICD-10-CM | POA: Diagnosis not present

## 2019-02-24 DIAGNOSIS — R634 Abnormal weight loss: Secondary | ICD-10-CM | POA: Diagnosis not present

## 2019-02-24 DIAGNOSIS — I509 Heart failure, unspecified: Secondary | ICD-10-CM | POA: Diagnosis not present

## 2019-02-24 DIAGNOSIS — K222 Esophageal obstruction: Secondary | ICD-10-CM | POA: Diagnosis not present

## 2019-02-24 DIAGNOSIS — J449 Chronic obstructive pulmonary disease, unspecified: Secondary | ICD-10-CM | POA: Diagnosis not present

## 2019-02-25 DIAGNOSIS — Z9981 Dependence on supplemental oxygen: Secondary | ICD-10-CM | POA: Diagnosis not present

## 2019-02-25 DIAGNOSIS — R1310 Dysphagia, unspecified: Secondary | ICD-10-CM | POA: Diagnosis not present

## 2019-02-25 DIAGNOSIS — Z681 Body mass index (BMI) 19 or less, adult: Secondary | ICD-10-CM | POA: Diagnosis not present

## 2019-02-25 DIAGNOSIS — F0390 Unspecified dementia without behavioral disturbance: Secondary | ICD-10-CM | POA: Diagnosis not present

## 2019-02-25 DIAGNOSIS — R634 Abnormal weight loss: Secondary | ICD-10-CM | POA: Diagnosis not present

## 2019-02-25 DIAGNOSIS — K219 Gastro-esophageal reflux disease without esophagitis: Secondary | ICD-10-CM | POA: Diagnosis not present

## 2019-02-25 DIAGNOSIS — R32 Unspecified urinary incontinence: Secondary | ICD-10-CM | POA: Diagnosis not present

## 2019-02-25 DIAGNOSIS — J449 Chronic obstructive pulmonary disease, unspecified: Secondary | ICD-10-CM | POA: Diagnosis not present

## 2019-02-25 DIAGNOSIS — K220 Achalasia of cardia: Secondary | ICD-10-CM | POA: Diagnosis not present

## 2019-02-25 DIAGNOSIS — K222 Esophageal obstruction: Secondary | ICD-10-CM | POA: Diagnosis not present

## 2019-02-25 DIAGNOSIS — I509 Heart failure, unspecified: Secondary | ICD-10-CM | POA: Diagnosis not present

## 2019-02-25 DIAGNOSIS — R159 Full incontinence of feces: Secondary | ICD-10-CM | POA: Diagnosis not present

## 2019-02-27 DIAGNOSIS — I509 Heart failure, unspecified: Secondary | ICD-10-CM | POA: Diagnosis not present

## 2019-02-27 DIAGNOSIS — R634 Abnormal weight loss: Secondary | ICD-10-CM | POA: Diagnosis not present

## 2019-02-27 DIAGNOSIS — K222 Esophageal obstruction: Secondary | ICD-10-CM | POA: Diagnosis not present

## 2019-02-27 DIAGNOSIS — J449 Chronic obstructive pulmonary disease, unspecified: Secondary | ICD-10-CM | POA: Diagnosis not present

## 2019-02-27 DIAGNOSIS — K219 Gastro-esophageal reflux disease without esophagitis: Secondary | ICD-10-CM | POA: Diagnosis not present

## 2019-02-27 DIAGNOSIS — K220 Achalasia of cardia: Secondary | ICD-10-CM | POA: Diagnosis not present

## 2019-03-03 DIAGNOSIS — J449 Chronic obstructive pulmonary disease, unspecified: Secondary | ICD-10-CM | POA: Diagnosis not present

## 2019-03-03 DIAGNOSIS — I509 Heart failure, unspecified: Secondary | ICD-10-CM | POA: Diagnosis not present

## 2019-03-03 DIAGNOSIS — R634 Abnormal weight loss: Secondary | ICD-10-CM | POA: Diagnosis not present

## 2019-03-03 DIAGNOSIS — K220 Achalasia of cardia: Secondary | ICD-10-CM | POA: Diagnosis not present

## 2019-03-03 DIAGNOSIS — K219 Gastro-esophageal reflux disease without esophagitis: Secondary | ICD-10-CM | POA: Diagnosis not present

## 2019-03-03 DIAGNOSIS — K222 Esophageal obstruction: Secondary | ICD-10-CM | POA: Diagnosis not present

## 2019-03-06 DIAGNOSIS — K220 Achalasia of cardia: Secondary | ICD-10-CM | POA: Diagnosis not present

## 2019-03-06 DIAGNOSIS — G309 Alzheimer's disease, unspecified: Secondary | ICD-10-CM | POA: Diagnosis not present

## 2019-03-06 DIAGNOSIS — E43 Unspecified severe protein-calorie malnutrition: Secondary | ICD-10-CM | POA: Diagnosis not present

## 2019-03-06 DIAGNOSIS — F39 Unspecified mood [affective] disorder: Secondary | ICD-10-CM | POA: Diagnosis not present

## 2019-03-06 DIAGNOSIS — J449 Chronic obstructive pulmonary disease, unspecified: Secondary | ICD-10-CM | POA: Diagnosis not present

## 2019-03-06 DIAGNOSIS — R634 Abnormal weight loss: Secondary | ICD-10-CM | POA: Diagnosis not present

## 2019-03-06 DIAGNOSIS — I872 Venous insufficiency (chronic) (peripheral): Secondary | ICD-10-CM

## 2019-03-06 DIAGNOSIS — K219 Gastro-esophageal reflux disease without esophagitis: Secondary | ICD-10-CM | POA: Diagnosis not present

## 2019-03-06 DIAGNOSIS — K222 Esophageal obstruction: Secondary | ICD-10-CM | POA: Diagnosis not present

## 2019-03-06 DIAGNOSIS — I509 Heart failure, unspecified: Secondary | ICD-10-CM | POA: Diagnosis not present

## 2019-03-09 DIAGNOSIS — R634 Abnormal weight loss: Secondary | ICD-10-CM | POA: Diagnosis not present

## 2019-03-09 DIAGNOSIS — R509 Fever, unspecified: Secondary | ICD-10-CM | POA: Diagnosis not present

## 2019-03-09 DIAGNOSIS — J449 Chronic obstructive pulmonary disease, unspecified: Secondary | ICD-10-CM | POA: Diagnosis not present

## 2019-03-09 DIAGNOSIS — I509 Heart failure, unspecified: Secondary | ICD-10-CM | POA: Diagnosis not present

## 2019-03-09 DIAGNOSIS — K222 Esophageal obstruction: Secondary | ICD-10-CM | POA: Diagnosis not present

## 2019-03-09 DIAGNOSIS — K220 Achalasia of cardia: Secondary | ICD-10-CM | POA: Diagnosis not present

## 2019-03-09 DIAGNOSIS — K219 Gastro-esophageal reflux disease without esophagitis: Secondary | ICD-10-CM | POA: Diagnosis not present

## 2019-03-10 DIAGNOSIS — K222 Esophageal obstruction: Secondary | ICD-10-CM | POA: Diagnosis not present

## 2019-03-10 DIAGNOSIS — R634 Abnormal weight loss: Secondary | ICD-10-CM | POA: Diagnosis not present

## 2019-03-10 DIAGNOSIS — K219 Gastro-esophageal reflux disease without esophagitis: Secondary | ICD-10-CM | POA: Diagnosis not present

## 2019-03-10 DIAGNOSIS — I509 Heart failure, unspecified: Secondary | ICD-10-CM | POA: Diagnosis not present

## 2019-03-10 DIAGNOSIS — K220 Achalasia of cardia: Secondary | ICD-10-CM | POA: Diagnosis not present

## 2019-03-10 DIAGNOSIS — J449 Chronic obstructive pulmonary disease, unspecified: Secondary | ICD-10-CM | POA: Diagnosis not present

## 2019-03-27 ENCOUNTER — Other Ambulatory Visit: Payer: Self-pay

## 2019-03-28 DEATH — deceased

## 2019-04-28 DEATH — deceased
